# Patient Record
Sex: Male | Born: 1965
Health system: Southern US, Community
[De-identification: ages and names within clinical notes are randomized; demographics above are authoritative.]

## PROBLEM LIST (undated history)

## (undated) DIAGNOSIS — M1990 Unspecified osteoarthritis, unspecified site: Secondary | ICD-10-CM

## (undated) DIAGNOSIS — I10 Essential (primary) hypertension: Secondary | ICD-10-CM

## (undated) DIAGNOSIS — R011 Cardiac murmur, unspecified: Secondary | ICD-10-CM

## (undated) DIAGNOSIS — K219 Gastro-esophageal reflux disease without esophagitis: Secondary | ICD-10-CM

## (undated) DIAGNOSIS — M109 Gout, unspecified: Secondary | ICD-10-CM

## (undated) HISTORY — PX: NO PAST SURGERIES: SHX2092

---

## 2001-07-26 ENCOUNTER — Emergency Department (HOSPITAL_COMMUNITY): Admission: EM | Admit: 2001-07-26 | Discharge: 2001-07-26 | Payer: Self-pay | Admitting: Emergency Medicine

## 2007-04-20 ENCOUNTER — Ambulatory Visit (HOSPITAL_COMMUNITY): Admission: RE | Admit: 2007-04-20 | Discharge: 2007-04-20 | Payer: Self-pay | Admitting: Family Medicine

## 2011-11-09 ENCOUNTER — Other Ambulatory Visit: Payer: Self-pay | Admitting: Otolaryngology

## 2011-11-09 DIAGNOSIS — R439 Unspecified disturbances of smell and taste: Secondary | ICD-10-CM

## 2011-11-13 ENCOUNTER — Ambulatory Visit
Admission: RE | Admit: 2011-11-13 | Discharge: 2011-11-13 | Disposition: A | Discharge status: Home / Self Care | Payer: 59 | Source: Ambulatory Visit | Attending: Otolaryngology | Admitting: Otolaryngology

## 2011-11-13 DIAGNOSIS — R439 Unspecified disturbances of smell and taste: Secondary | ICD-10-CM

## 2012-06-30 ENCOUNTER — Other Ambulatory Visit: Payer: Self-pay | Admitting: Orthopedic Surgery

## 2012-07-11 ENCOUNTER — Other Ambulatory Visit: Payer: Self-pay | Admitting: Orthopedic Surgery

## 2012-07-21 ENCOUNTER — Encounter (HOSPITAL_COMMUNITY): Payer: Self-pay | Admitting: Pharmacy Technician

## 2012-07-23 NOTE — Pre-Procedure Instructions (Signed)
George Maldonado  07/23/2012   Your procedure is scheduled on:  08-01-2012  Monday  Report to [facility] at 5:30 AM.  Call this number if you have problems the morning of surgery: [phone]   Remember:   Do not eat food or drink liquids after midnight.   Take these medicines the morning of surgery with A SIP OF WATER: none   Do not wear jewelry,  Do not wear lotions, powders, or perfumes. .  Do not shave 48 hours prior to surgery. Men may shave face and neck.  Do not bring valuables to the hospital.  Contacts, dentures or bridgework may not be worn into surgery.   Leave suitcase in the car. After surgery it may be brought to your room.   For patients admitted to the hospital, checkout time is 11:00 AM the day of discharge.   Patients discharged the day of surgery will not be allowed to drive home.    Special Instructions: Shower using CHG 2 nights before surgery and the night before surgery.  If you shower the day of surgery use CHG.  Use special wash - you have one bottle of CHG for all showers.  You should use approximately 1/3 of the bottle for each shower.   Please read over the following fact sheets that you were given: Pain Booklet, Coughing and Deep Breathing, Blood Transfusion Information and Surgical Site Infection Prevention

## 2012-07-25 ENCOUNTER — Encounter (HOSPITAL_COMMUNITY)
Admission: RE | Admit: 2012-07-25 | Discharge: 2012-07-25 | Disposition: A | Discharge status: Home / Self Care | Payer: 59 | Source: Ambulatory Visit | Attending: Orthopedic Surgery | Admitting: Orthopedic Surgery

## 2012-07-25 ENCOUNTER — Encounter (HOSPITAL_COMMUNITY): Payer: Self-pay

## 2012-07-25 HISTORY — DX: Essential (primary) hypertension: I10

## 2012-07-25 HISTORY — DX: Gout, unspecified: M10.9

## 2012-07-25 LAB — BASIC METABOLIC PANEL
Calcium: 9.7 mg/dL (ref 8.4–10.5)
GFR calc Af Amer: >90 mL/min (ref >90)
GFR calc non Af Amer: 82 mL/min — ABNORMAL LOW (ref >90)
Glucose, Bld: 94 mg/dL (ref 70–99)
Potassium: 3.9 mEq/L (ref 3.5–5.1)
Sodium: 139 mEq/L (ref 135–145)

## 2012-07-25 LAB — CBC WITH DIFFERENTIAL/PLATELET
Basophils Relative: 1 % (ref 0–1)
Eosinophils Absolute: 0.2 10*3/uL (ref 0.0–0.7)
Lymphs Abs: 2.6 10*3/uL (ref 0.7–4.0)
MCH: 28.5 pg (ref 26.0–34.0)
MCHC: 34.5 g/dL (ref 30.0–36.0)
Neutro Abs: 5 10*3/uL (ref 1.7–7.7)
Neutrophils Relative %: 60 % (ref 43–77)
Platelets: 198 10*3/uL (ref 150–400)
RBC: 5.09 MIL/uL (ref 4.22–5.81)

## 2012-07-25 LAB — ABO/RH: ABO/RH(D): O POS

## 2012-07-25 LAB — TYPE AND SCREEN: ABO/RH(D): O POS

## 2012-07-25 LAB — URINALYSIS, ROUTINE W REFLEX MICROSCOPIC
Bilirubin Urine: NEGATIVE
Glucose, UA: NEGATIVE mg/dL
Ketones, ur: NEGATIVE mg/dL
pH: 5 (ref 5.0–8.0)

## 2012-07-25 LAB — URINE MICROSCOPIC-ADD ON

## 2012-07-25 LAB — PROTIME-INR
INR: 1.01 (ref 0.00–1.49)
Prothrombin Time: 13.2 seconds (ref 11.6–15.2)

## 2012-07-25 LAB — SURGICAL PCR SCREEN: Staphylococcus aureus: NEGATIVE

## 2012-07-26 NOTE — H&P (Signed)
TOTAL HIP ADMISSION H&P  Patient is admitted for right total hip arthroplasty.  Subjective:  Chief Complaint: right hip pain  HPI: George Maldonado, 47 y.o. male, has a history of pain and functional disability in the right hip(s) due to arthritis and patient has failed non-surgical conservative treatments for greater than 12 weeks to include NSAID's and/or analgesics, corticosteriod injections and activity modification.  Onset of symptoms was gradual starting 2 years ago with gradually worsening course since that time.The patient noted no past surgery on the right hip(s).  Patient currently rates pain in the right hip at 8 out of 10 with activity. Patient has night pain and worsening of pain with activity and weight bearing. Patient has evidence of subchondral cysts, periarticular osteophytes and joint space narrowing by imaging studies. This condition presents safety issues increasing the risk of falls.  There is no current active infection.  There are no active problems to display for this patient.  Past Medical History  Diagnosis Date  . Hypertension   . Gout     Past Surgical History  Procedure Laterality Date  . No past surgeries      No prescriptions prior to admission   No Known Allergies  History  Substance Use Topics  . Smoking status: Never Smoker   . Smokeless tobacco: Not on file  . Alcohol Use: No    No family history on file.   Review of Systems  Constitutional: Negative.   HENT: Negative.   Eyes: Negative.   Respiratory: Negative.   Cardiovascular: Negative.   Gastrointestinal: Negative.   Genitourinary: Negative.   Musculoskeletal: Positive for joint pain.  Skin: Negative.   Neurological: Negative.   Endo/Heme/Allergies: Negative.   Psychiatric/Behavioral: Negative.     Objective:  Physical Exam  Constitutional: He is oriented to person, place, and time. He appears well-developed and well-nourished.  HENT:  Head: Normocephalic.  Eyes: Pupils  are equal, round, and reactive to light.  Cardiovascular: Intact distal pulses.   Respiratory: Effort normal.  Musculoskeletal:       Right hip: He exhibits decreased range of motion and crepitus.  Neurological: He is alert and oriented to person, place, and time.  Psychiatric: He has a normal mood and affect.    Vital signs in last 24 hours:    Labs:   There is no height or weight on file to calculate BMI.   Imaging Review Plain radiographs demonstrate severe degenerative joint disease of the right hip(s). The bone quality appears to be adequate for age and reported activity level.  Assessment/Plan:  End stage arthritis, right hip(s)  The patient history, physical examination, clinical judgement of the provider and imaging studies are consistent with end stage degenerative joint disease of the right hip(s) and total hip arthroplasty is deemed medically necessary. The treatment options including medical management, injection therapy, arthroscopy and arthroplasty were discussed at length. The risks and benefits of total hip arthroplasty were presented and reviewed. The risks due to aseptic loosening, infection, stiffness, dislocation/subluxation,  thromboembolic complications and other imponderables were discussed.  The patient acknowledged the explanation, agreed to proceed with the plan and consent was signed. Patient is being admitted for inpatient treatment for surgery, pain control, PT, OT, prophylactic antibiotics, VTE prophylaxis, progressive ambulation and ADL's and discharge planning.The patient is planning to be discharged home with home health services

## 2012-07-31 MED ORDER — DEXTROSE 5 % IV SOLN
3.0000 g | INTRAVENOUS | Status: DC
  Filled 2012-07-31: qty 3000

## 2012-08-01 ENCOUNTER — Inpatient Hospital Stay (HOSPITAL_COMMUNITY)
Admission: RE | Admit: 2012-08-01 | Discharge: 2012-08-02 | DRG: 470 | Disposition: A | Discharge status: Home / Self Care | Payer: 59 | Source: Ambulatory Visit | Attending: Orthopedic Surgery | Admitting: Orthopedic Surgery

## 2012-08-01 ENCOUNTER — Ambulatory Visit (HOSPITAL_COMMUNITY): Payer: 59 | Admitting: Anesthesiology

## 2012-08-01 ENCOUNTER — Encounter (HOSPITAL_COMMUNITY): Payer: Self-pay | Admitting: Anesthesiology

## 2012-08-01 ENCOUNTER — Encounter (HOSPITAL_COMMUNITY): Payer: Self-pay | Admitting: *Deleted

## 2012-08-01 ENCOUNTER — Inpatient Hospital Stay (HOSPITAL_COMMUNITY): Payer: 59

## 2012-08-01 ENCOUNTER — Encounter (HOSPITAL_COMMUNITY)
Admission: RE | Disposition: A | Discharge status: Home / Self Care | Payer: Self-pay | Source: Ambulatory Visit | Attending: Orthopedic Surgery

## 2012-08-01 DIAGNOSIS — I10 Essential (primary) hypertension: Secondary | ICD-10-CM | POA: Diagnosis present

## 2012-08-01 DIAGNOSIS — M1611 Unilateral primary osteoarthritis, right hip: Secondary | ICD-10-CM | POA: Diagnosis present

## 2012-08-01 DIAGNOSIS — M109 Gout, unspecified: Secondary | ICD-10-CM | POA: Diagnosis present

## 2012-08-01 DIAGNOSIS — Z7982 Long term (current) use of aspirin: Secondary | ICD-10-CM

## 2012-08-01 DIAGNOSIS — M169 Osteoarthritis of hip, unspecified: Principal | ICD-10-CM | POA: Diagnosis present

## 2012-08-01 DIAGNOSIS — M1610 Unilateral primary osteoarthritis, unspecified hip: Principal | ICD-10-CM | POA: Diagnosis present

## 2012-08-01 HISTORY — PX: TOTAL HIP ARTHROPLASTY: SHX124

## 2012-08-01 SURGERY — ARTHROPLASTY, HIP, TOTAL,POSTERIOR APPROACH
Anesthesia: General | Site: Hip | Laterality: Right | Wound class: Clean

## 2012-08-01 MED ORDER — PROPOFOL 10 MG/ML IV BOLUS
INTRAVENOUS | Status: DC | PRN
  Administered 2012-08-01: 50 mg via INTRAVENOUS
  Administered 2012-08-01: 150 mg via INTRAVENOUS

## 2012-08-01 MED ORDER — VALSARTAN-HYDROCHLOROTHIAZIDE 80-12.5 MG PO TABS: 1.0000 | ORAL_TABLET | Freq: Every evening | ORAL | Status: DC

## 2012-08-01 MED ORDER — HYDROMORPHONE HCL PF 1 MG/ML IJ SOLN
INTRAMUSCULAR | Status: AC
  Filled 2012-08-01: qty 1

## 2012-08-01 MED ORDER — BUPIVACAINE LIPOSOME 1.3 % IJ SUSP
20.0000 mL | INTRAMUSCULAR | Status: DC
  Filled 2012-08-01: qty 20

## 2012-08-01 MED ORDER — ZOLPIDEM TARTRATE 5 MG PO TABS: 5.0000 mg | ORAL_TABLET | Freq: Every evening | ORAL | Status: DC | PRN

## 2012-08-01 MED ORDER — HYDROMORPHONE HCL PF 1 MG/ML IJ SOLN
0.2500 mg | INTRAMUSCULAR | Status: DC | PRN
  Administered 2012-08-01 (×4): 0.5 mg via INTRAVENOUS

## 2012-08-01 MED ORDER — DIPHENHYDRAMINE HCL 12.5 MG/5ML PO ELIX: 12.5000 mg | ORAL_SOLUTION | ORAL | Status: DC | PRN

## 2012-08-01 MED ORDER — ACETAMINOPHEN 650 MG RE SUPP: 650.0000 mg | Freq: Four times a day (QID) | RECTAL | Status: DC | PRN

## 2012-08-01 MED ORDER — ROCURONIUM BROMIDE 100 MG/10ML IV SOLN
INTRAVENOUS | Status: DC | PRN
  Administered 2012-08-01: 30 mg via INTRAVENOUS
  Administered 2012-08-01: 50 mg via INTRAVENOUS
  Administered 2012-08-01 (×2): 10 mg via INTRAVENOUS

## 2012-08-01 MED ORDER — FLEET ENEMA 7-19 GM/118ML RE ENEM: 1.0000 | ENEMA | Freq: Once | RECTAL | Status: AC | PRN

## 2012-08-01 MED ORDER — ACETAMINOPHEN 10 MG/ML IV SOLN
1000.0000 mg | Freq: Four times a day (QID) | INTRAVENOUS | Status: AC
  Administered 2012-08-01 – 2012-08-02 (×4): 1000 mg via INTRAVENOUS
  Filled 2012-08-01 (×4): qty 100

## 2012-08-01 MED ORDER — DEXTROSE-NACL 5-0.45 % IV SOLN: INTRAVENOUS | Status: DC

## 2012-08-01 MED ORDER — FENTANYL CITRATE 0.05 MG/ML IJ SOLN
INTRAMUSCULAR | Status: DC | PRN
  Administered 2012-08-01 (×2): 100 ug via INTRAVENOUS
  Administered 2012-08-01: 50 ug via INTRAVENOUS

## 2012-08-01 MED ORDER — METOCLOPRAMIDE HCL 5 MG PO TABS
5.0000 mg | ORAL_TABLET | Freq: Three times a day (TID) | ORAL | Status: DC | PRN
  Filled 2012-08-01: qty 2

## 2012-08-01 MED ORDER — ACETAMINOPHEN 325 MG PO TABS: 650.0000 mg | ORAL_TABLET | Freq: Four times a day (QID) | ORAL | Status: DC | PRN

## 2012-08-01 MED ORDER — OXYCODONE HCL 5 MG/5ML PO SOLN: 5.0000 mg | Freq: Once | ORAL | Status: DC | PRN

## 2012-08-01 MED ORDER — HYDROCHLOROTHIAZIDE 12.5 MG PO CAPS
12.5000 mg | ORAL_CAPSULE | Freq: Every day | ORAL | Status: DC
  Administered 2012-08-01 – 2012-08-02 (×2): 12.5 mg via ORAL
  Filled 2012-08-01 (×2): qty 1

## 2012-08-01 MED ORDER — PHENOL 1.4 % MT LIQD: 1.0000 | OROMUCOSAL | Status: DC | PRN

## 2012-08-01 MED ORDER — SODIUM CHLORIDE 0.9 % IJ SOLN
INTRAMUSCULAR | Status: DC | PRN
  Administered 2012-08-01: 08:00:00

## 2012-08-01 MED ORDER — BUPIVACAINE-EPINEPHRINE (PF) 0.5% -1:200000 IJ SOLN
INTRAMUSCULAR | Status: AC
  Filled 2012-08-01: qty 10

## 2012-08-01 MED ORDER — MENTHOL 3 MG MT LOZG: 1.0000 | LOZENGE | OROMUCOSAL | Status: DC | PRN

## 2012-08-01 MED ORDER — METHOCARBAMOL 100 MG/ML IJ SOLN
500.0000 mg | Freq: Four times a day (QID) | INTRAVENOUS | Status: DC | PRN
  Filled 2012-08-01: qty 5

## 2012-08-01 MED ORDER — BISACODYL 5 MG PO TBEC: 5.0000 mg | DELAYED_RELEASE_TABLET | Freq: Every day | ORAL | Status: DC | PRN

## 2012-08-01 MED ORDER — LIDOCAINE HCL (CARDIAC) 20 MG/ML IV SOLN
INTRAVENOUS | Status: DC | PRN
  Administered 2012-08-01: 100 mg via INTRAVENOUS

## 2012-08-01 MED ORDER — HYDROMORPHONE HCL PF 1 MG/ML IJ SOLN
1.0000 mg | INTRAMUSCULAR | Status: DC | PRN
  Administered 2012-08-01 – 2012-08-02 (×3): 1 mg via INTRAVENOUS
  Filled 2012-08-01 (×3): qty 1

## 2012-08-01 MED ORDER — MEPERIDINE HCL 25 MG/ML IJ SOLN: 6.2500 mg | INTRAMUSCULAR | Status: DC | PRN

## 2012-08-01 MED ORDER — MAGNESIUM HYDROXIDE 400 MG/5ML PO SUSP: 30.0000 mL | Freq: Every day | ORAL | Status: DC | PRN

## 2012-08-01 MED ORDER — NEOSTIGMINE METHYLSULFATE 1 MG/ML IJ SOLN
INTRAMUSCULAR | Status: DC | PRN
  Administered 2012-08-01: 5 mg via INTRAVENOUS

## 2012-08-01 MED ORDER — OXYCODONE HCL 5 MG PO TABS: 5.0000 mg | ORAL_TABLET | Freq: Once | ORAL | Status: DC | PRN

## 2012-08-01 MED ORDER — ARTIFICIAL TEARS OP OINT
TOPICAL_OINTMENT | OPHTHALMIC | Status: DC | PRN
  Administered 2012-08-01: 1 via OPHTHALMIC

## 2012-08-01 MED ORDER — GLYCOPYRROLATE 0.2 MG/ML IJ SOLN
INTRAMUSCULAR | Status: DC | PRN
  Administered 2012-08-01: .8 mg via INTRAVENOUS

## 2012-08-01 MED ORDER — SODIUM CHLORIDE 0.9 % IJ SOLN
INTRAMUSCULAR | Status: AC
  Filled 2012-08-01: qty 6

## 2012-08-01 MED ORDER — ASPIRIN EC 325 MG PO TBEC
325.0000 mg | DELAYED_RELEASE_TABLET | Freq: Two times a day (BID) | ORAL | Status: DC
  Administered 2012-08-01 – 2012-08-02 (×2): 325 mg via ORAL
  Filled 2012-08-01 (×4): qty 1

## 2012-08-01 MED ORDER — ALBUMIN HUMAN 5 % IV SOLN
INTRAVENOUS | Status: DC | PRN
  Administered 2012-08-01: 09:00:00 via INTRAVENOUS

## 2012-08-01 MED ORDER — CHLORHEXIDINE GLUCONATE 4 % EX LIQD: 60.0000 mL | Freq: Once | CUTANEOUS | Status: DC

## 2012-08-01 MED ORDER — SODIUM CHLORIDE 0.9 % IR SOLN
Status: DC | PRN
  Administered 2012-08-01: 1000 mL

## 2012-08-01 MED ORDER — METHOCARBAMOL 500 MG PO TABS
500.0000 mg | ORAL_TABLET | Freq: Four times a day (QID) | ORAL | Status: DC | PRN
  Administered 2012-08-01: 500 mg via ORAL
  Filled 2012-08-01: qty 1

## 2012-08-01 MED ORDER — METOCLOPRAMIDE HCL 5 MG/ML IJ SOLN: 5.0000 mg | Freq: Three times a day (TID) | INTRAMUSCULAR | Status: DC | PRN

## 2012-08-01 MED ORDER — ALUM & MAG HYDROXIDE-SIMETH 200-200-20 MG/5ML PO SUSP: 30.0000 mL | ORAL | Status: DC | PRN

## 2012-08-01 MED ORDER — LACTATED RINGERS IV SOLN
INTRAVENOUS | Status: DC | PRN
  Administered 2012-08-01 (×3): via INTRAVENOUS

## 2012-08-01 MED ORDER — CELECOXIB 200 MG PO CAPS
200.0000 mg | ORAL_CAPSULE | Freq: Two times a day (BID) | ORAL | Status: DC
  Administered 2012-08-01 – 2012-08-02 (×3): 200 mg via ORAL
  Filled 2012-08-01 (×4): qty 1

## 2012-08-01 MED ORDER — DEXTROSE 5 % IV SOLN
3.0000 g | INTRAVENOUS | Status: DC | PRN
  Administered 2012-08-01: 3 g via INTRAVENOUS

## 2012-08-01 MED ORDER — SIMVASTATIN 20 MG PO TABS
20.0000 mg | ORAL_TABLET | Freq: Every evening | ORAL | Status: DC
  Administered 2012-08-01 – 2012-08-02 (×2): 20 mg via ORAL
  Filled 2012-08-01 (×2): qty 1

## 2012-08-01 MED ORDER — ONDANSETRON HCL 4 MG PO TABS: 4.0000 mg | ORAL_TABLET | Freq: Four times a day (QID) | ORAL | Status: DC | PRN

## 2012-08-01 MED ORDER — MIDAZOLAM HCL 5 MG/5ML IJ SOLN
INTRAMUSCULAR | Status: DC | PRN
  Administered 2012-08-01 (×2): 1 mg via INTRAVENOUS

## 2012-08-01 MED ORDER — KCL IN DEXTROSE-NACL 20-5-0.45 MEQ/L-%-% IV SOLN
INTRAVENOUS | Status: DC
  Administered 2012-08-01: 13:00:00 via INTRAVENOUS
  Filled 2012-08-01 (×6): qty 1000

## 2012-08-01 MED ORDER — ALLOPURINOL 100 MG PO TABS
100.0000 mg | ORAL_TABLET | Freq: Every day | ORAL | Status: DC | PRN
  Filled 2012-08-01: qty 1

## 2012-08-01 MED ORDER — OXYCODONE HCL 5 MG PO TABS
5.0000 mg | ORAL_TABLET | ORAL | Status: DC | PRN
Start: 2012-08-01 — End: 2012-08-02
  Administered 2012-08-01 – 2012-08-02 (×4): 10 mg via ORAL
  Filled 2012-08-01 (×4): qty 2

## 2012-08-01 MED ORDER — DEXTROSE 5 % IV SOLN
INTRAVENOUS | Status: DC | PRN
  Administered 2012-08-01: 08:00:00 via INTRAVENOUS

## 2012-08-01 MED ORDER — ONDANSETRON HCL 4 MG/2ML IJ SOLN
INTRAMUSCULAR | Status: DC | PRN
  Administered 2012-08-01: 4 mg via INTRAVENOUS

## 2012-08-01 MED ORDER — ONDANSETRON HCL 4 MG/2ML IJ SOLN: 4.0000 mg | Freq: Four times a day (QID) | INTRAMUSCULAR | Status: DC | PRN

## 2012-08-01 MED ORDER — ONDANSETRON HCL 4 MG/2ML IJ SOLN: 4.0000 mg | Freq: Once | INTRAMUSCULAR | Status: DC | PRN

## 2012-08-01 MED ORDER — BUPIVACAINE-EPINEPHRINE 0.5% -1:200000 IJ SOLN
INTRAMUSCULAR | Status: DC | PRN
  Administered 2012-08-01: 1 mL

## 2012-08-01 MED ORDER — IRBESARTAN 75 MG PO TABS
75.0000 mg | ORAL_TABLET | Freq: Every day | ORAL | Status: DC
  Administered 2012-08-01 – 2012-08-02 (×2): 75 mg via ORAL
  Filled 2012-08-01 (×2): qty 1

## 2012-08-01 SURGICAL SUPPLY — 54 items
BLADE SAW SAG 73X25 THK (BLADE) ×1
BLADE SAW SGTL 18X1.27X75 (BLADE) IMPLANT
BLADE SAW SGTL 73X25 THK (BLADE) ×1 IMPLANT
BLADE SAW SGTL MED 73X18.5 STR (BLADE) IMPLANT
BRUSH FEMORAL CANAL (MISCELLANEOUS) IMPLANT
CLOTH BEACON ORANGE TIMEOUT ST (SAFETY) ×2 IMPLANT
COVER BACK TABLE 24X17X13 BIG (DRAPES) IMPLANT
COVER SURGICAL LIGHT HANDLE (MISCELLANEOUS) ×4 IMPLANT
DRAPE ORTHO SPLIT 77X108 STRL (DRAPES) ×2
DRAPE PROXIMA HALF (DRAPES) ×2 IMPLANT
DRAPE SURG ORHT 6 SPLT 77X108 (DRAPES) ×1 IMPLANT
DRAPE U-SHAPE 47X51 STRL (DRAPES) ×2 IMPLANT
DRILL BIT 7/64X5 (BIT) ×2 IMPLANT
DRSG MEPILEX BORDER 4X12 (GAUZE/BANDAGES/DRESSINGS) ×2 IMPLANT
DRSG MEPILEX BORDER 4X8 (GAUZE/BANDAGES/DRESSINGS) ×1 IMPLANT
DURAPREP 26ML APPLICATOR (WOUND CARE) ×2 IMPLANT
ELECT BLADE 4.0 EZ CLEAN MEGAD (MISCELLANEOUS)
ELECT REM PT RETURN 9FT ADLT (ELECTROSURGICAL) ×2
ELECTRODE BLDE 4.0 EZ CLN MEGD (MISCELLANEOUS) IMPLANT
ELECTRODE REM PT RTRN 9FT ADLT (ELECTROSURGICAL) ×1 IMPLANT
GAUZE XEROFORM 1X8 LF (GAUZE/BANDAGES/DRESSINGS) ×1 IMPLANT
GLOVE BIO SURGEON STRL SZ7 (GLOVE) ×2 IMPLANT
GLOVE BIO SURGEON STRL SZ7.5 (GLOVE) ×2 IMPLANT
GLOVE BIOGEL PI IND STRL 7.0 (GLOVE) ×1 IMPLANT
GLOVE BIOGEL PI IND STRL 8 (GLOVE) ×1 IMPLANT
GLOVE BIOGEL PI INDICATOR 7.0 (GLOVE) ×1
GLOVE BIOGEL PI INDICATOR 8 (GLOVE) ×1
GOWN PREVENTION PLUS XLARGE (GOWN DISPOSABLE) ×2 IMPLANT
GOWN STRL NON-REIN LRG LVL3 (GOWN DISPOSABLE) ×4 IMPLANT
HANDPIECE INTERPULSE COAX TIP (DISPOSABLE)
HOOD PEEL AWAY FACE SHEILD DIS (HOOD) ×4 IMPLANT
KIT BASIN OR (CUSTOM PROCEDURE TRAY) ×2 IMPLANT
KIT ROOM TURNOVER OR (KITS) ×2 IMPLANT
MANIFOLD NEPTUNE II (INSTRUMENTS) ×2 IMPLANT
NEEDLE 22X1 1/2 (OR ONLY) (NEEDLE) ×2 IMPLANT
NS IRRIG 1000ML POUR BTL (IV SOLUTION) ×2 IMPLANT
PACK TOTAL JOINT (CUSTOM PROCEDURE TRAY) ×2 IMPLANT
PAD ARMBOARD 7.5X6 YLW CONV (MISCELLANEOUS) ×4 IMPLANT
PASSER SUT SWANSON 36MM LOOP (INSTRUMENTS) ×2 IMPLANT
PRESSURIZER FEMORAL UNIV (MISCELLANEOUS) IMPLANT
SET HNDPC FAN SPRY TIP SCT (DISPOSABLE) IMPLANT
SPONGE LAP 18X18 X RAY DECT (DISPOSABLE) ×2 IMPLANT
SUT ETHIBOND 2 V 37 (SUTURE) ×2 IMPLANT
SUT ETHILON 3 0 FSL (SUTURE) ×2 IMPLANT
SUT VIC AB 0 CTB1 27 (SUTURE) ×2 IMPLANT
SUT VIC AB 1 CTX 36 (SUTURE) ×2
SUT VIC AB 1 CTX36XBRD ANBCTR (SUTURE) ×1 IMPLANT
SUT VIC AB 2-0 CTB1 (SUTURE) ×2 IMPLANT
SYR CONTROL 10ML LL (SYRINGE) ×2 IMPLANT
TOWEL OR 17X24 6PK STRL BLUE (TOWEL DISPOSABLE) ×2 IMPLANT
TOWEL OR 17X26 10 PK STRL BLUE (TOWEL DISPOSABLE) ×2 IMPLANT
TOWER CARTRIDGE SMART MIX (DISPOSABLE) IMPLANT
TRAY FOLEY CATH 14FR (SET/KITS/TRAYS/PACK) IMPLANT
WATER STERILE IRR 1000ML POUR (IV SOLUTION) ×6 IMPLANT

## 2012-08-01 NOTE — Anesthesia Procedure Notes (Signed)
Procedure Name: Intubation Date/Time: 08/01/2012 7:37 AM Performed by: Marni Griffon Pre-anesthesia Checklist: Patient identified, Emergency Drugs available, Suction available and Patient being monitored Patient Re-evaluated:Patient Re-evaluated prior to inductionOxygen Delivery Method: Circle system utilized Preoxygenation: Pre-oxygenation with 100% oxygen Intubation Type: IV induction Ventilation: Mask ventilation without difficulty Laryngoscope Size: Mac and 4 Grade View: Grade II Tube type: Oral Tube size: 8.0 mm Number of attempts: 2 (Dr. Michelle Piper asked CRNA to scope b/f NDMR fully on board) Airway Equipment and Method: Stylet Placement Confirmation: ETT inserted through vocal cords under direct vision,  breath sounds checked- equal and bilateral and positive ETCO2 Secured at: 22 (cm at teeth) cm Tube secured with: Tape Dental Injury: Dental damage  Comments: Ground small amt powder from left incisor; edge felt smooth after intubation.

## 2012-08-01 NOTE — Evaluation (Signed)
Physical Therapy Evaluation Patient Details Name: George Maldonado MRN: [id number] DOB: 1965-09-07 Today's Date: 08/01/2012 Time: 4098-1191 PT Time Calculation (min): 34 min  PT Assessment / Plan / Recommendation Clinical Impression  Pt is a pleasant 47 y.o. male s/p R THA POD#0. Pt is highly motivated. Presents with decreased mobility, decreased independence with gt/transfers secondary to pain. Anticipate D/C tomorrow if medically stable and rehab goals are met. Will need to attempt stairs in morning session. Pt will benefit from skilled PT to maximize functional mobility. Plan for HHPT upon D/C.    PT Assessment  Patient needs continued PT services    Follow Up Recommendations  Home health PT;Supervision - Intermittent    Does the patient have the potential to tolerate intense rehabilitation      Barriers to Discharge        Equipment Recommendations  Rolling walker with 5" wheels;Other (comment) (3 in 1 commode )    Recommendations for Other Services OT consult   Frequency 7X/week    Precautions / Restrictions Precautions Precautions: Posterior Hip;Fall Precaution Booklet Issued: Yes (comment) Precaution Comments: pt given handout on hip precautions; at end of session able to recall 2/3 indpendently  Restrictions Weight Bearing Restrictions: Yes RLE Weight Bearing: Weight bearing as tolerated   Pertinent Vitals/Pain 6/10 prior to therapy; pre medicated; 6.5/10 at end of session; pt repositioned in chair.       Mobility  Bed Mobility Bed Mobility: Supine to Sit;Sitting - Scoot to Edge of Bed Supine to Sit: 5: Supervision;With rails Sitting - Scoot to Edge of Bed: 5: Supervision Details for Bed Mobility Assistance: demo good technique; required min cues for hand placement and sequencing to adhere to hip precautions Transfers Transfers: Sit to Stand;Stand to Sit Sit to Stand: 4: Min assist;From elevated surface;With upper extremity assist;From bed Stand to Sit:  4: Min guard;With armrests;With upper extremity assist;To chair/3-in-1 Details for Transfer Assistance: cues for hip precautions, hand placement and RW safety; pt anxious intially  Ambulation/Gait Ambulation/Gait Assistance: 4: Min guard Ambulation Distance (Feet): 200 Feet Assistive device: Rolling walker Ambulation/Gait Assistance Details: encouraged to equalize weightshifting and to increase step length on L LE and increase heel strike on R LE; correctable with cues  Gait Pattern: Decreased step length - left;Decreased stance time - right;Antalgic Gait velocity: decreased Stairs: No Wheelchair Mobility Wheelchair Mobility: No    Exercises Total Joint Exercises Ankle Circles/Pumps: AROM;Both;10 reps;Supine Long Arc Quad: AROM;Right;10 reps;Seated   PT Diagnosis: Difficulty walking;Acute pain  PT Problem List: Decreased strength;Decreased range of motion;Decreased activity tolerance;Decreased balance;Decreased mobility;Decreased knowledge of use of DME;Pain;Decreased knowledge of precautions PT Treatment Interventions: DME instruction;Gait training;Stair training;Functional mobility training;Therapeutic activities;Therapeutic exercise;Balance training;Neuromuscular re-education;Patient/family education   PT Goals Acute Rehab PT Goals PT Goal Formulation: With patient Time For Goal Achievement: 08/06/12 Potential to Achieve Goals: Good Pt will go Supine/Side to Sit: with modified independence PT Goal: Supine/Side to Sit - Progress: Goal set today Pt will go Sit to Stand: with modified independence PT Goal: Sit to Stand - Progress: Goal set today Pt will go Stand to Sit: with modified independence PT Goal: Stand to Sit - Progress: Goal set today Pt will Transfer Bed to Chair/Chair to Bed: with modified independence PT Transfer Goal: Bed to Chair/Chair to Bed - Progress: Goal set today Pt will Ambulate: >150 feet;with modified independence;with least restrictive assistive device PT  Goal: Ambulate - Progress: Goal set today Pt will Go Up / Down Stairs: 3-5 stairs;with supervision;with rail(s) PT Goal: Up/Down Stairs -  Progress: Goal set today Pt will Perform Home Exercise Program: with supervision, verbal cues required/provided PT Goal: Perform Home Exercise Program - Progress: Goal set today Additional Goals Additional Goal #1: Pt will be able to indpendently recall 3/3 posterior hip preacutions.  PT Goal: Additional Goal #1 - Progress: Goal set today  Visit Information  Last PT Received On: 08/01/12 Assistance Needed: +1    Subjective Data  Subjective: "we are getting up now? I hope to go home tomorrow so lets do this"  Patient Stated Goal: home tomorrow with wife   Prior Functioning  Home Living Lives With: Spouse Available Help at Discharge: Family;Available 24 hours/day Type of Home: House Home Access: Stairs to enter [employer] of Steps: 2 (2-10 pending approach he wants to attempt ) Entrance Stairs-Rails: Right Home Layout: One level Bathroom Shower/Tub: Tub/shower unit;C[employer]ain Firefighter: Handicapped height Bathroom Accessibility: Yes How Accessible: Accessible via walker Home Adaptive Equipment: None Prior Function Level of Independence: Independent Able to Take Stairs?: Yes Driving: Yes 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 Communication: No difficulties Dominant Hand: Right    Cognition  Cognition Arousal/Alertness: Awake/alert Behavior During Therapy: WFL for tasks assessed/performed Overall Cognitive Status: Within Functional Limits for tasks assessed    Extremity/Trunk Assessment Right Lower Extremity Assessment RLE ROM/Strength/Tone: Deficits;Unable to fully assess;Due to pain;Due to precautions RLE ROM/Strength/Tone Deficits: Knee and ankle WFL RLE Sensation: WFL - Light Touch Left Lower Extremity Assessment LLE ROM/Strength/Tone: Within functional levels LLE Sensation: WFL -  Light Touch Trunk Assessment Trunk Assessment: Normal   Balance Balance Balance Assessed: No  End of Session PT - End of Session Equipment Utilized During Treatment: Gait belt Activity Tolerance: Patient tolerated treatment well Patient left: in chair;with call bell/phone within reach;with family/visitor present Nurse Communication: Mobility status;Patient requests pain meds  GP     Donell Sievert, [place] [phone] 08/01/2012, 3:57 PM

## 2012-08-01 NOTE — Op Note (Signed)
OPERATIVE REPORT    DATE OF PROCEDURE:  08/01/2012       PREOPERATIVE DIAGNOSIS:  OSTEOARTHRITIS RIGHT HIP                                                          POSTOPERATIVE DIAGNOSIS:  osteoarthritis right hip                                                           PROCEDURE:  R total hip arthroplasty using a 58 mm DePuy Pinnacle  Cup, [employer], 10-degree polyethylene liner index superior  and posterior, a +0 36 mm ceramic head, a [phone] SROM stem, 20FXXL Sleeve   SURGEON: Caliann Leckrone J    ASSISTANT:   Mauricia Area, PA-C  (present throughout entire procedure and necessary for timely completion of the procedure)   ANESTHESIA: General BLOOD LOSS: 600 FLUID REPLACEMENT: 2000 crystalloid DRAINS: Foley Catheter URINE OUTPUT: 300cc COMPLICATIONS: none    INDICATIONS FOR PROCEDURE: A 47 y.o. year-old With  OSTEOARTHRITIS RIGHT HIP   for 3 years, x-rays show bone-on-bone arthritic changes. Despite conservative measures with observation, anti-inflammatory medicine, narcotics, use of a cane, has severe unremitting pain and can ambulate only a few blocks before resting.  Patient desires elective R total hip arthroplasty to decrease pain and increase function. The risks, benefits, and alternatives were discussed at length including but not limited to the risks of infection, bleeding, nerve injury, stiffness, blood clots, the need for revision surgery, cardiopulmonary complications, among others, and they were willing to proceed. Questions answered     PROCEDURE IN DETAIL: The patient was identified by armband,  received preoperative IV antibiotics in the holding area at [facility], taken to the operating room , appropriate anesthetic monitors  were attached and general endotracheal anesthesia induced. Foley catheter was inserted. Pt was rolled into the L lateral decubitus position and fixed there with a Stulberg Mark II pelvic clamp.  The R lower extremity was  then prepped and draped  in the usual sterile fashion from the ankle to the hemipelvis. A time-out  procedure was performed. The skin along the lateral hip and thigh  infiltrated with 10 mL of 0.5% Marcaine and epinephrine solution. We  then made a posterolateral approach to the hip. With a #10 blade, a 20 cm  incision was made through the skin and subcutaneous tissue down to the level of the  IT band. Small bleeders were identified and cauterized. The IT band was cut in  line with skin incision exposing the greater trochanter. A Cobra retractor was placed between the gluteus minimus and the superior hip joint capsule, and a spiked Cobra between the quadratus femoris and the inferior hip joint capsule. This isolated the short  external rotators and piriformis tendons. These were tagged with a #2 Ethibond  suture and cut off their insertion on the intertrochanteric crest. The posterior  capsule was then developed into an acetabular-based flap from Posterior Superior off of the acetabulum out over the femoral neck and back posterior inferior to the acetabular rim. This flap was tagged with two #2 Ethibond sutures  and retracted protecting the sciatic nerve. This exposed the arthritic femoral head and osteophytes. The hip was then flexed and internally rotated, dislocating the femoral head and a standard neck cut performed 1 fingerbreadth above the lesser trochanter.  A spiked Cobra was placed in the cotyloid notch and a Hohmann retractor was then used to lever the femur anteriorly off of the anterior pelvic column. A posterior-inferior wing retractor was placed at the junction of the acetabulum and the ischium completing the acetabular exposure.We then removed the peripheral osteophytes and labrum from the acetabulum. We then reamed the acetabulum up to 57 mm with basket reamers obtaining good coverage in all quadrants. We then irrigated with normal  saline solution and hammered into place a 58 mm pinnacle  cup in 45  degrees of abduction and about 20 degrees of anteversion. More  peripheral osteophytes removed and a trial 10-degree liner placed with the  index superior-posterior. The hip was then flexed and internally rotated exposing the  proximal femur, which was entered with the initiating reamer followed by  the axial reamers up to a 15.5 mm full depth and 16mm partial depth. We then conically reamed to 28F to the correct depth for a 42 base neck. The calcar was milled to 28FXXL. A trial cone and stem was inserted in the 25 degrees anteversion, with a +0 36mm trial head. Trial reduction was then performed and excellent stability was noted with at 90 of flexion with 75 of internal rotation and then full extension with maximal external rotation. The hip could not be dislocated in full extension. The knee could easily flex  to about 130 degrees. We also stretched the abductors at this point,  because of the preexisting adductor contractures. All trial components  were then removed. The acetabulum was irrigated out with normal saline  solution. A titanium Apex [facility] was then screwed into place  followed by a 10-degree polyethylene liner index superior-posterior. On  the femoral side a 28FXXL ZTT1 sleeve was hammered into place, followed by a [id number] SROM stem in 25 degrees of anteversion. At this point, a +0 36 mm ceramic head was  hammered on the stem. The hip was reduced. We checked our stability  one more time and found it to be excellent. The wound was once again  thoroughly irrigated out with normal saline solution pulse lavage. The  capsular flap and short external rotators were repaired back to the  intertrochanteric crest through drill holes with a #2 Ethibond suture.  The IT band was closed with running 1 Vicryl suture. The subcutaneous  tissue with 0 and 2-0 undyed Vicryl suture and the skin with running  interlocking 3-0 nylon suture. Dressing of Xeroform and Mepilex was   then applied. The patient was then unclamped, rolled supine, awaken extubated and taken to recovery room without difficulty in stable condition.   Tracen Mahler J 08/01/2012, 9:28 AM

## 2012-08-01 NOTE — Addendum Note (Signed)
Addendum created 08/01/12 1105 by Marni Griffon, CRNA   Modules edited: Anesthesia Medication Administration

## 2012-08-01 NOTE — Transfer of Care (Signed)
Immediate Anesthesia Transfer of Care Note  Patient: George Maldonado  Procedure(s) Performed: Procedure(s) with comments: TOTAL HIP ARTHROPLASTY (Right) - DEPUY, NEEDS LONG TABLE PATIENT 6'8" 290 LBS  Patient Location: PACU  Anesthesia Type:General  Level of Consciousness: awake, alert , oriented and patient cooperative  Airway & Oxygen Therapy: Patient Spontanous Breathing and Patient connected to nasal cannula oxygen  Post-op Assessment: Report given to PACU RN, Post -op Vital signs reviewed and stable and Patient moving all extremities  Post vital signs: Reviewed and stable  Complications: No apparent anesthesia complications

## 2012-08-01 NOTE — Interval H&P Note (Signed)
History and Physical Interval Note:  08/01/2012 7:11 AM  George Maldonado  has presented today for surgery, with the diagnosis of OSTEOARTHRITIS RIGHT HIP  The various methods of treatment have been discussed with the patient and family. After consideration of risks, benefits and other options for treatment, the patient has consented to  Procedure(s) with comments: TOTAL HIP ARTHROPLASTY (Right) - DEPUY, NEEDS LONG TABLE PATIENT 6'8" 290 LBS as a surgical intervention .  The patient's history has been reviewed, patient examined, no change in status, stable for surgery.  I have reviewed the patient's chart and labs.  Questions were answered to the patient's satisfaction.     Nestor Lewandowsky

## 2012-08-01 NOTE — Anesthesia Preprocedure Evaluation (Addendum)
Anesthesia Evaluation  Patient identified by MRN, date of birth, ID band Patient awake    Reviewed: Allergy & Precautions, H&P , NPO status , Patient's Chart, lab work & pertinent test results, reviewed documented beta blocker date and time   Airway Mallampati: I TM Distance: >3 FB Neck ROM: Full    Dental  (+) Teeth Intact and Dental Advisory Given   Pulmonary          Cardiovascular hypertension, Pt. on medications     Neuro/Psych    GI/Hepatic   Endo/Other    Renal/GU      Musculoskeletal   Abdominal   Peds  Hematology   Anesthesia Other Findings   Reproductive/Obstetrics                         Anesthesia Physical Anesthesia Plan  ASA: II  Anesthesia Plan: General   Post-op Pain Management:    Induction: Intravenous  Airway Management Planned: Oral ETT  Additional Equipment:   Intra-op Plan:   Post-operative Plan: Extubation in OR  Informed Consent: I have reviewed the patients History and Physical, chart, labs and discussed the procedure including the risks, benefits and alternatives for the proposed anesthesia with the patient or authorized representative who has indicated his/her understanding and acceptance.     Plan Discussed with: CRNA and Surgeon  Anesthesia Plan Comments:         Anesthesia Quick Evaluation

## 2012-08-01 NOTE — Interval H&P Note (Signed)
History and Physical Interval Note:  08/01/2012 7:10 AM  George Maldonado  has presented today for surgery, with the diagnosis of OSTEOARTHRITIS RIGHT HIP  The various methods of treatment have been discussed with the patient and family. After consideration of risks, benefits and other options for treatment, the patient has consented to  Procedure(s) with comments: TOTAL HIP ARTHROPLASTY (Right) - DEPUY, NEEDS LONG TABLE PATIENT 6'8" 290 LBS as a surgical intervention .  The patient's history has been reviewed, patient examined, no change in status, stable for surgery.  I have reviewed the patient's chart and labs.  Questions were answered to the patient's satisfaction.     Nestor Lewandowsky

## 2012-08-01 NOTE — Plan of Care (Signed)
Problem: Consults Goal: Diagnosis- Total Joint Replacement Primary Total Hip     

## 2012-08-01 NOTE — Preoperative (Signed)
Beta Blockers   Reason not to administer Beta Blockers:Not Applicable 

## 2012-08-01 NOTE — Anesthesia Postprocedure Evaluation (Signed)
Anesthesia Post Note  Patient: George Maldonado  Procedure(s) Performed: Procedure(s) (LRB): TOTAL HIP ARTHROPLASTY (Right)  Anesthesia type: general  Patient location: PACU  Post pain: Pain level controlled  Post assessment: Patient's Cardiovascular Status Stable  Last Vitals:  Filed Vitals:   08/01/12 1029  BP: 160/92  Pulse: 57  Temp:   Resp: 19    Post vital signs: Reviewed and stable  Level of consciousness: sedated  Complications: No apparent anesthesia complications

## 2012-08-02 ENCOUNTER — Encounter (HOSPITAL_COMMUNITY): Payer: Self-pay | Admitting: Orthopedic Surgery

## 2012-08-02 DIAGNOSIS — M1611 Unilateral primary osteoarthritis, right hip: Secondary | ICD-10-CM | POA: Diagnosis present

## 2012-08-02 LAB — BASIC METABOLIC PANEL
CO2: 30 mEq/L (ref 19–32)
Calcium: 8.1 mg/dL — ABNORMAL LOW (ref 8.4–10.5)
Creatinine, Ser: 1.29 mg/dL (ref 0.50–1.35)
Glucose, Bld: 113 mg/dL — ABNORMAL HIGH (ref 70–99)
Sodium: 135 mEq/L (ref 135–145)

## 2012-08-02 LAB — CBC
Hemoglobin: 9.8 g/dL — ABNORMAL LOW (ref 13.0–17.0)
MCH: 28.2 pg (ref 26.0–34.0)
MCV: 83.9 fL (ref 78.0–100.0)
RBC: 3.48 MIL/uL — ABNORMAL LOW (ref 4.22–5.81)

## 2012-08-02 MED ORDER — ASPIRIN 325 MG PO TBEC: 325.0000 mg | DELAYED_RELEASE_TABLET | Freq: Two times a day (BID) | ORAL | Status: DC

## 2012-08-02 MED ORDER — METHOCARBAMOL 500 MG PO TABS: 500.0000 mg | ORAL_TABLET | Freq: Four times a day (QID) | ORAL | Status: DC | PRN

## 2012-08-02 MED ORDER — OXYCODONE HCL 5 MG PO TABS: 5.0000 mg | ORAL_TABLET | ORAL | Status: DC | PRN

## 2012-08-02 NOTE — Progress Notes (Signed)
RN notified George Maldonado, [place] of patients inability to void. She agrees to increase his water intake and keep him until he is able to void. Patient will be discharged if he is able to void spontaneously.

## 2012-08-02 NOTE — Progress Notes (Signed)
Patient able to spontaneously void . He was provided with discharge instructions and follow up information and is going home with his wife. He is aware of hip precautions and when to call the MD if needed for signs/symptoms of infection.

## 2012-08-02 NOTE — Progress Notes (Addendum)
Patient ID: George Maldonado, male   DOB: [birth year], 47 y.o.   MRN: [id number] PATIENT ID: George Maldonado  MRN: [id number]  DOB/AGE:  1965-07-27 / 47 y.o.  1 Day Post-Op Procedure(s) (LRB): TOTAL HIP ARTHROPLASTY (Right)    PROGRESS NOTE Subjective: Patient is alert, oriented,no Nausea, no Vomiting, no passing gas, no Bowel Movement. Taking PO well. Denies SOB, Chest or Calf Pain. Using Incentive Spirometer, PAS in place. Ambulate WBAT Patient reports pain as 5 on 0-10 scale  .    Objective: Vital signs in last 24 hours: Filed Vitals:   08/01/12 2352 08/02/12 0131 08/02/12 0451 08/02/12 0600  BP:  92/46 107/55 94/60  Pulse:  80  68  Temp: 98.8 F (37.1 C) 99.4 F (37.4 C)  100.2 F (37.9 C)  TempSrc: Oral Oral  Oral  Resp:  18  18  SpO2:  96%  95%      Intake/Output from previous day: I/O last 3 completed shifts: In: 3280 [P.O.:480; I.V.:2550; IV Piggyback:250] Out: 1300 [Urine:775; Blood:525]   Intake/Output this shift: Total I/O In: 1620 [P.O.:120; I.V.:1500] Out: 1200 [Urine:1200]   LABORATORY DATA:  Recent Labs  08/02/12 0550  WBC 8.5  HGB 9.8*  HCT 29.2*  PLT 156    Examination: Neurologically intact ABD soft Neurovascular intact Sensation intact distally Intact pulses distally Dorsiflexion/Plantar flexion intact Incision: moderate drainage No cellulitis present Compartment soft} XR AP&Lat of hip shows well placed\fixed THA  Assessment:   1 Day Post-Op Procedure(s) (LRB): TOTAL HIP ARTHROPLASTY (Right) ADDITIONAL DIAGNOSIS:  gout  Plan: PT/OT WBAT, THA  posterior precautions  DVT Prophylaxis: SCDx72 hrs, ASA 325 mg BID x 2 weeks  DISCHARGE PLAN: Home, today if passes PT  DISCHARGE NEEDS: HHPT, HHRN, CPM, Walker and 3-in-1 comode seat

## 2012-08-02 NOTE — Care Management Note (Signed)
CARE MANAGEMENT NOTE 08/02/2012  Patient:  George Maldonado,George Maldonado   Account Number:  [id number]  Date Initiated:  08/02/2012  Documentation initiated by:  Vance Peper  Subjective/Objective Assessment:   47 yr old male s/p right total hip arthroplasty.     Action/Plan:   Cm spoke with patient and wife concerning home health and DME needs at discharge. Choice offered .[facility] pre[facility] with [facility], no changes. DME being delivered to patient's room.   Anticipated DC Date:  08/02/2012   Anticipated DC Plan:  HOME W HOME HEALTH SERVICES      DC Planning Services  CM consult      PAC Choice  DURABLE MEDICAL EQUIPMENT  HOME HEALTH   Choice offered to / List presented to:  C-1 Patient   DME arranged  WALKER - TALL  WALKER - ROLLING  3-N-1  TUB BENCH      DME agency  [employer]     HH arranged  HH-2 PT      HH agency  [facility]   Status of service:  Completed, signed off Medicare Important Message given?   (If response is "NO", the following Medicare IM given date fields will be blank) Date Medicare IM given:   Date Additional Medicare IM given:    Discharge Disposition:  HOME W HOME HEALTH SERVICES  Per UR Regulation:    If discussed at Long Length of Stay Meetings, dates discussed:    Comments:

## 2012-08-02 NOTE — Evaluation (Signed)
Occupational Therapy Evaluation Patient Details Name: George Maldonado MRN: [id number] DOB: 1965-09-20 Today's Date: 08/02/2012 Time: 4098-1191 OT Time Calculation (min): 26 min  OT Assessment / Plan / Recommendation Clinical Impression    Pt is a pleasant 47 y.o. male s/p R THA.  Pt is highly motivated. Pt at [place] level for LB ADLs with sit to stand transfer. Practiced tub transfer with tub bench and pt at Minguard level. No cues needed to maintain hip precautions during session.  Pt planning to d/c home today. Pt will have assistance available at home upon d/c.      OT Assessment  Patient does not need any further OT services    Follow Up Recommendations  No OT follow up;Supervision/Assistance - 24 hour    Barriers to Discharge      Equipment Recommendations  Tub/shower bench;3 in 1 bedside comode    Recommendations for Other Services    Frequency       Precautions / Restrictions Precautions Precautions: Posterior Hip;Fall Precaution Booklet Issued: Yes (comment) Precaution Comments: Pt able to state 2/3 precautions. No cues needed for pt to maintain precautions in session. Restrictions Weight Bearing Restrictions: Yes RLE Weight Bearing: Weight bearing as tolerated   Pertinent Vitals/Pain No pain reported.     ADL  Eating/Feeding: Independent Where Assessed - Eating/Feeding: Chair Grooming: Supervision/safety Where Assessed - Grooming: Supported standing Upper Body Bathing: Set up Where Assessed - Upper Body Bathing: Unsupported sitting Lower Body Bathing: Min guard Where Assessed - Lower Body Bathing: Supported sit to stand Upper Body Dressing: Set up Where Assessed - Upper Body Dressing: Unsupported sitting Lower Body Dressing: Min guard Where Assessed - Lower Body Dressing: Supported sit to Pharmacist, hospital: Supervision/safety Statistician Method: Sit to Barista: Raised toilet seat with arms (or 3-in-1 over  toilet) Toileting - Clothing Manipulation and Hygiene: Min guard Where Assessed - Engineer, mining and Hygiene: Sit to stand from 3-in-1 or toilet Tub/Shower Transfer: Min guard Tub/Shower Transfer Method: Science writer: Counsellor Used: Rolling walker;Sock aid;Reacher;Long-handled shoe horn;Long-handled sponge Transfers/Ambulation Related to ADLs: Minguard for ambulation ADL Comments: Pt at [employer] level for LB ADLs with AE and supported sit to stand transfer.     OT Diagnosis:    OT Problem List:   OT Treatment Interventions:     OT Goals    Visit Information  Last OT Received On: 08/02/12 Assistance Needed: +1    Subjective Data      Prior Functioning     Home Living Lives With: Spouse Available Help at Discharge: Family;Available 24 hours/day Type of Home: House Home Access: Stairs to enter [employer] of Steps: 2 (2-10 pending approach he wants to attempt) Entrance Stairs-Rails: Right Home Layout: One level Bathroom Shower/Tub: Tub/shower unit;Curtain Firefighter: Handicapped height Bathroom Accessibility: Yes How Accessible: Accessible via walker Home Adaptive Equipment: None Prior Function Level of Independence: Independent Able to Take Stairs?: Yes Driving: Yes Vocation: Full time employment Comments: [employer]  Communication Communication: No difficulties Dominant Hand: Right         Vision/Perception     Cognition  Cognition Arousal/Alertness: Awake/alert Behavior During Therapy: WFL for tasks assessed/performed Overall Cognitive Status: Within Functional Limits for tasks assessed    Extremity/Trunk Assessment Right Upper Extremity Assessment RUE ROM/Strength/Tone: Within functional levels Left Upper Extremity Assessment LUE ROM/Strength/Tone: Within functional levels     Mobility Bed Mobility Bed Mobility: Not assessed Transfers Transfers: Sit to Stand;Stand to  Sit Sit  to Stand: With upper extremity assist;From chair/3-in-1;5: Supervision Stand to Sit: With upper extremity assist;To chair/3-in-1;5: Supervision Details for Transfer Assistance: no cues required.         Balance   End of Session OT - End of Session Activity Tolerance: Patient tolerated treatment well Patient left: in chair;with call bell/phone within reach;with family/visitor present Nurse Communication: Other (comment) (equipment needs)  GO     Earlie Raveling OTR/L [phone] 08/02/2012, 12:04 PM

## 2012-08-02 NOTE — Progress Notes (Signed)
Physical Therapy Treatment Patient Details Name: George Maldonado MRN: [id number] DOB: September 20, 1965 Today's Date: 08/02/2012 Time: 9147-8295 PT Time Calculation (min): 23 min  PT Assessment / Plan / Recommendation Comments on Treatment Session  Pt is progressing towards goals with therapy. Clear from PT standpoint to D/C home with family and HHPT.     Follow Up Recommendations  Home health PT;Supervision - Intermittent     Does the patient have the potential to tolerate intense rehabilitation     Barriers to Discharge        Equipment Recommendations  Rolling walker with 5" wheels;Other (comment) (Tall RW)    Recommendations for Other Services    Frequency 7X/week   Plan Discharge plan remains appropriate;Frequency remains appropriate    Precautions / Restrictions Precautions Precautions: Posterior Hip;Fall Precaution Booklet Issued: Yes (comment) Precaution Comments: Pt able to independently recall 2/3 hip precautions  Restrictions Weight Bearing Restrictions: Yes RLE Weight Bearing: Weight bearing as tolerated   Pertinent Vitals/Pain Denis pain; reports his R hip is "sore"; premedicated.     Mobility  Bed Mobility Bed Mobility: Not assessed Transfers Transfers: Sit to Stand;Stand to Sit Sit to Stand: From chair/3-in-1;With armrests;6: Modified independent (Device/Increase time) Stand to Sit: To chair/3-in-1;With armrests;6: Modified independent (Device/Increase time) Details for Transfer Assistance: no cues required; required increased time  Ambulation/Gait Ambulation/Gait Assistance: 5: Supervision Ambulation Distance (Feet): 200 Feet Assistive device: Rolling walker Ambulation/Gait Assistance Details: cont to amb with decreased step length on  L LE; unable to equalize WB; min cues for upright posture and gt sequencing  Gait Pattern: Decreased step length - left;Decreased stance time - right;Antalgic Gait velocity: decreased Stairs: Yes Stairs Assistance: 4:  Min guard;Other (comment) (Cues and handout given for sequencing and safety ) Stair Management Technique: No rails;Backwards;With walker Number of Stairs: 2 Wheelchair Mobility Wheelchair Mobility: No    Exercises Total Joint Exercises Ankle Circles/Pumps: AROM;Both;10 reps;Seated Quad Sets: AROM;Right;10 reps;Seated Hip ABduction/ADduction: AROM;Right;10 reps;Seated Long Arc Quad: AROM;Right;10 reps;Seated   PT Diagnosis:    PT Problem List:   PT Treatment Interventions:     PT Goals Acute Rehab PT Goals PT Goal Formulation: With patient Time For Goal Achievement: 08/06/12 Potential to Achieve Goals: Good PT Goal: Sit to Stand - Progress: Met PT Goal: Stand to Sit - Progress: Met PT Transfer Goal: Bed to Chair/Chair to Bed - Progress: Progressing toward goal PT Goal: Ambulate - Progress: Progressing toward goal PT Goal: Up/Down Stairs - Progress: Progressing toward goal PT Goal: Perform Home Exercise Program - Progress: Progressing toward goal Additional Goals PT Goal: Additional Goal #1 - Progress: Progressing toward goal  Visit Information  Last PT Received On: 08/02/12 Assistance Needed: +1    Subjective Data  Subjective: "I had an OK night. I am ready to go home as soon as we do steps"  Patient Stated Goal: home   Cognition  Cognition Arousal/Alertness: Awake/alert Behavior During Therapy: WFL for tasks assessed/performed Overall Cognitive Status: Within Functional Limits for tasks assessed    Balance  Balance Balance Assessed: No  End of Session PT - End of Session Equipment Utilized During Treatment: Gait belt Activity Tolerance: Patient tolerated treatment well Patient left: in chair;with call bell/phone within reach;with family/visitor present Nurse Communication: Mobility status;Other (comment) (DME needs)   GP     Donell Sievert, [place] [phone] 08/02/2012, 11:59 AM

## 2012-08-02 NOTE — Progress Notes (Signed)
Pt unable to void after foley removed at 0630. Patient complains of feeling the urge to void but not able to initiate stream. Bladder scan performed and In and out cath done with of urine. Will continue to monitor for spontaneous void before sending patient home.

## 2012-08-02 NOTE — Discharge Summary (Signed)
Patient ID: George Maldonado MRN: [id number] DOB/AGE: 47-16-1967 47 y.o.  Admit date: 08/01/2012 Discharge date: 08/02/2012  Admission Diagnoses:  Principal Problem:   Osteoarthritis of right hip   Discharge Diagnoses:  Same  Past Medical History  Diagnosis Date  . Hypertension   . Gout     Surgeries: Procedure(s): TOTAL HIP ARTHROPLASTY on 08/01/2012   Consultants:    Discharged Condition: Improved  Hospital Course: George Maldonado is an 47 y.o. male who was admitted 08/01/2012 for operative treatment ofOsteoarthritis of right hip. Patient has severe unremitting pain that affects sleep, daily activities, and work/hobbies. After pre-op clearance the patient was taken to the operating room on 08/01/2012 and underwent  Procedure(s): TOTAL HIP ARTHROPLASTY.  Patient ambulated 200 feet on the date of surgery, and has passed physical therapy goals prior to discharge. Dressing was changed prior to discharge.  Patient was given perioperative antibiotics: Anti-infectives   Start     Dose/Rate Route Frequency Ordered Stop   07/31/12 0948  ceFAZolin (ANCEF) 3 g in dextrose 5 % 50 mL IVPB  Status:  Discontinued     3 g 160 mL/hr over 30 Minutes Intravenous On call to O.R. 07/31/12 0948 08/01/12 1159       Patient was given sequential compression devices, early ambulation, and chemoprophylaxis to prevent DVT.  Patient benefited maximally from hospital stay and there were no complications.    Recent vital signs: Patient Vitals for the past 24 hrs:  BP Temp Temp src Pulse Resp SpO2  08/02/12 0600 94/60 mmHg 100.2 F (37.9 C) Oral 68 18 95 %  08/02/12 0451 107/55 mmHg - - - - -  08/02/12 0131 92/46 mmHg 99.4 F (37.4 C) Oral 80 18 96 %  08/01/12 2352 - 98.8 F (37.1 C) Oral - - -  08/01/12 2149 124/62 mmHg 99.9 F (37.7 C) Oral 73 18 94 %  08/01/12 1716 142/76 mmHg 98.9 F (37.2 C) - 89 18 100 %  08/01/12 1155 134/73 mmHg 99.2 F (37.3 C) - 90 18 100 %  08/01/12  1145 - - - 98 14 100 %  08/01/12 1135 - 97.9 F (36.6 C) - - - -  08/01/12 1129 145/77 mmHg - - 80 17 100 %  08/01/12 1114 153/69 mmHg - - 74 14 100 %  08/01/12 1059 154/75 mmHg - - 68 17 100 %  08/01/12 1044 155/84 mmHg - - 64 16 100 %  08/01/12 1029 160/92 mmHg - - 57 19 100 %  08/01/12 1014 136/104 mmHg - - 65 23 98 %  08/01/12 1000 - 99 F (37.2 C) - - - -  08/01/12 0959 148/92 mmHg - - 70 20 92 %     Recent laboratory studies:  Recent Labs  08/02/12 0550  WBC 8.5  HGB 9.8*  HCT 29.2*  PLT 156  NA 135  K 3.7  CL 100  CO2 30  BUN 13  CREATININE 1.29  GLUCOSE 113*  CALCIUM 8.1*     Discharge Medications:     Medication List    TAKE these medications       acetaminophen 500 MG tablet  Commonly known as:  TYLENOL  Take 1,000 mg by mouth 2 (two) times daily as needed for pain.     allopurinol 100 MG tablet  Commonly known as:  ZYLOPRIM  Take 100 mg by mouth daily as needed (for gout flare ups).     aspirin 325 MG EC tablet  Take 1  tablet (325 mg total) by mouth 2 (two) times daily.     meloxicam 15 MG tablet  Commonly known as:  MOBIC  Take 15 mg by mouth every evening.     methocarbamol 500 MG tablet  Commonly known as:  ROBAXIN  Take 1 tablet (500 mg total) by mouth every 6 (six) hours as needed.     oxyCODONE 5 MG immediate release tablet  Commonly known as:  Oxy IR/ROXICODONE  Take 1-2 tablets (5-10 mg total) by mouth every 3 (three) hours as needed.     simvastatin 20 MG tablet  Commonly known as:  ZOCOR  Take 20 mg by mouth every evening.     valsartan-hydrochlorothiazide 80-12.5 MG per tablet  Commonly known as:  DIOVAN-HCT  Take 1 tablet by mouth every evening.        Diagnostic Studies: Dg Chest 2 View  07/25/2012  *RADIOLOGY REPORT*  Clinical Data: Preoperative respiratory exam for right hip arthroplasty  CHEST - 2 VIEW  Comparison: 04/19/2088  Findings: Heart size is normal.  Mediastinal shadows are normal. Lungs are clear.  No  effusions.  No bony abnormalities.  IMPRESSION: Normal chest   Original Report Authenticated By: Paulina Fusi, M.D.    Dg Pelvis Portable  08/01/2012  *RADIOLOGY REPORT*  Clinical Data: 47 year old male status post right hip surgery.  PORTABLE PELVIS  Comparison: None.  Findings: Portable AP view 1015 hours.  Right bipolar hip arthroplasty.  Hardware appears intact and normally aligned on this AP view.  No unexpected osseous changes identified.  Visible pelvis intact.  IMPRESSION: Right hip arthroplasty with no adverse features identified.   Original Report Authenticated By: Erskine Speed, M.D.    Dg Hip Portable 1 View Right  08/01/2012  *RADIOLOGY REPORT*  Clinical Data: 47 year-old male status post right hip surgery.  PORTABLE RIGHT HIP - 1 VIEW  Comparison: 1015 hours the same day and earlier.  Findings: Two portable cross-table lateral views of the right hip demonstrate normal alignment of the femoral and acetabular components.  Hardware appears intact.  IMPRESSION: Right hip arthroplasty with no adverse features.   Original Report Authenticated By: Erskine Speed, M.D.     Disposition: Final discharge disposition not confirmed      Discharge Orders   Future Orders Complete By Expires     Call MD / Call 911  As directed     Comments:      If you experience chest pain or shortness of breath, CALL 911 and be transported to the hospital emergency room.  If you develope a fever above 101 F, pus (white drainage) or increased drainage or redness at the wound, or calf pain, call your surgeon's office.    Change dressing  As directed     Comments:      Please place new Mepilex dressing    Change dressing  As directed     Comments:      You may change your dressing on POD #5    Constipation Prevention  As directed     Comments:      Drink plenty of fluids.  Prune juice may be helpful.  You may use a stool softener, such as Colace (over the counter) 100 mg twice a day.  Use MiraLax (over the counter)  for constipation as needed.    Diet - low sodium heart healthy  As directed     Driving restrictions  As directed     Comments:  No driving for 2 weeks    Follow the hip precautions as taught in Physical Therapy  As directed     Increase activity slowly as tolerated  As directed     Patient may shower  As directed     Comments:      You may shower without a dressing once there is no drainage.  Do not wash over the wound.  If drainage remains, cover wound with plastic wrap and then shower.       Follow-up Information   Follow up with Nestor Lewandowsky, MD In 2 weeks.   Contact information:   [address] [place] [address] [phone]        Signed: Nestor Lewandowsky 08/02/2012, 7:06 AM

## 2015-11-05 ENCOUNTER — Ambulatory Visit (INDEPENDENT_AMBULATORY_CARE_PROVIDER_SITE_OTHER): Payer: 59 | Admitting: Orthopaedic Surgery

## 2015-11-05 ENCOUNTER — Encounter: Payer: Self-pay | Admitting: Orthopaedic Surgery

## 2015-11-05 ENCOUNTER — Ambulatory Visit (HOSPITAL_COMMUNITY)
Admission: RE | Admit: 2015-11-05 | Discharge: 2015-11-05 | Disposition: A | Discharge status: Home / Self Care | Payer: 59 | Source: Ambulatory Visit | Attending: Orthopaedic Surgery | Admitting: Orthopaedic Surgery

## 2015-11-05 VITALS — BP 132/78 | HR 57 | Temp 97.7°F | Ht 79.0 in | Wt 295.0 lb

## 2015-11-05 DIAGNOSIS — M25552 Pain in left hip: Secondary | ICD-10-CM

## 2015-11-05 DIAGNOSIS — M25511 Pain in right shoulder: Secondary | ICD-10-CM | POA: Diagnosis not present

## 2015-11-05 MED ORDER — PREDNISONE 5 MG (21) PO TBPK: ORAL_TABLET | ORAL | 0 refills | Status: DC
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
## 2015-11-05 NOTE — Patient Instructions (Signed)
Continue current medications.  Precautions discussed.

## 2016-01-23 ENCOUNTER — Other Ambulatory Visit (HOSPITAL_COMMUNITY): Payer: Self-pay | Admitting: Orthopedic Surgery

## 2016-01-23 DIAGNOSIS — Z96641 Presence of right artificial hip joint: Secondary | ICD-10-CM

## 2016-01-31 ENCOUNTER — Encounter (HOSPITAL_COMMUNITY)
Admission: RE | Admit: 2016-01-31 | Discharge: 2016-01-31 | Disposition: A | Discharge status: Home / Self Care | Payer: 59 | Source: Ambulatory Visit | Attending: Orthopedic Surgery | Admitting: Orthopedic Surgery

## 2016-01-31 DIAGNOSIS — Z96641 Presence of right artificial hip joint: Secondary | ICD-10-CM | POA: Diagnosis present

## 2016-01-31 MED ORDER — TECHNETIUM TC 99M MEDRONATE IV KIT
21.2000 | PACK | Freq: Once | INTRAVENOUS | Status: AC | PRN
  Administered 2016-01-31: 21.2 via INTRAVENOUS

## 2016-06-19 DIAGNOSIS — M5442 Lumbago with sciatica, left side: Secondary | ICD-10-CM | POA: Diagnosis not present

## 2016-06-19 DIAGNOSIS — M9903 Segmental and somatic dysfunction of lumbar region: Secondary | ICD-10-CM | POA: Diagnosis not present

## 2016-06-19 DIAGNOSIS — M9905 Segmental and somatic dysfunction of pelvic region: Secondary | ICD-10-CM | POA: Diagnosis not present

## 2016-06-22 DIAGNOSIS — M9903 Segmental and somatic dysfunction of lumbar region: Secondary | ICD-10-CM | POA: Diagnosis not present

## 2016-06-22 DIAGNOSIS — M5442 Lumbago with sciatica, left side: Secondary | ICD-10-CM | POA: Diagnosis not present

## 2016-06-22 DIAGNOSIS — M9905 Segmental and somatic dysfunction of pelvic region: Secondary | ICD-10-CM | POA: Diagnosis not present

## 2016-07-07 DIAGNOSIS — E782 Mixed hyperlipidemia: Secondary | ICD-10-CM | POA: Diagnosis not present

## 2016-07-07 DIAGNOSIS — Z1389 Encounter for screening for other disorder: Secondary | ICD-10-CM | POA: Diagnosis not present

## 2016-07-07 DIAGNOSIS — I10 Essential (primary) hypertension: Secondary | ICD-10-CM | POA: Diagnosis not present

## 2016-07-13 ENCOUNTER — Encounter (INDEPENDENT_AMBULATORY_CARE_PROVIDER_SITE_OTHER): Payer: Self-pay | Admitting: *Deleted

## 2016-08-13 DIAGNOSIS — M1612 Unilateral primary osteoarthritis, left hip: Secondary | ICD-10-CM | POA: Diagnosis not present

## 2016-08-13 DIAGNOSIS — M545 Low back pain: Secondary | ICD-10-CM | POA: Diagnosis not present

## 2016-10-19 ENCOUNTER — Other Ambulatory Visit: Payer: Self-pay | Admitting: Orthopedic Surgery

## 2016-10-21 NOTE — Pre-Procedure Instructions (Signed)
Sender Rueb Chance  10/21/2016      [id number] [place], [place] [phone] [facility] [place] [address] [address] Phone: [phone] Fax: [phone]    Your procedure is scheduled on November 02, 2016  Report to [facility] Admitting at 530 AM.  Call this number if you have problems the morning of surgery:  [phone]   Remember:  Do not eat food or drink liquids after midnight.  Take these medicines the morning of surgery with A SIP OF WATER allopurinol (zyloprim), levocetirizine (xyzal), eye drops.  7 days prior to surgery STOP taking any Mobic, Aspirin, Aleve, Naproxen, Ibuprofen, Motrin, Advil, Goody's, BC's, all herbal medications, fish oil, and all vitamins   Do not wear jewelry, make-up or nail polish.  Do not wear lotions, powders, or perfumes, or deoderant.  Men may shave face and neck.  Do not bring valuables to the hospital.  [facility] is not responsible for any belongings or valuables.  Contacts, dentures or bridgework may not be worn into surgery.  Leave your suitcase in the car.  After surgery it may be brought to your room.  For patients admitted to the hospital, discharge time will be determined by your treatment team.  Patients discharged the day of surgery will not be allowed to drive home.   Special instructions:   [facility]- Preparing For Surgery  Before surgery, you can play an important role. Because skin is not sterile, your skin needs to be as free of germs as possible. You can reduce the number of germs on your skin by washing with CHG (chlorahexidine gluconate) Soap before surgery.  CHG is an antiseptic cleaner which kills germs and bonds with the skin to continue killing germs even after washing.  Please do not use if you have an allergy to CHG or antibacterial soaps. If your skin becomes reddened/irritated stop using the CHG.  Do not shave (including legs and underarms) for at  least 48 hours prior to first CHG shower. It is OK to shave your face.  Please follow these instructions carefully.   1. Shower the NIGHT BEFORE SURGERY and the MORNING OF SURGERY with CHG.   2. If you chose to wash your hair, wash your hair first as usual with your normal shampoo.  3. After you shampoo, rinse your hair and body thoroughly to remove the shampoo.  4. Use CHG as you would any other liquid soap. You can apply CHG directly to the skin and wash gently with a scrungie or a clean washcloth.   5. Apply the CHG Soap to your body ONLY FROM THE NECK DOWN.  Do not use on open wounds or open sores. Avoid contact with your eyes, ears, mouth and genitals (private parts). Wash genitals (private parts) with your normal soap.  6. Wash thoroughly, paying special attention to the area where your surgery will be performed.  7. Thoroughly rinse your body with warm water from the neck down.  8. DO NOT shower/wash with your normal soap after using and rinsing off the CHG Soap.  9. Pat yourself dry with a CLEAN TOWEL.   10. Wear CLEAN PAJAMAS   11. Place CLEAN SHEETS on your bed the night of your first shower and DO NOT SLEEP WITH PETS.    Day of Surgery: Do not apply any deodorants/lotions. Please wear clean clothes to the hospital/surgery center.     Please read over the  following fact sheets that you were given. Pain Booklet, Coughing and Deep Breathing, MRSA Information and Surgical Site Infection Prevention

## 2016-10-22 ENCOUNTER — Encounter (HOSPITAL_COMMUNITY)
Admission: RE | Admit: 2016-10-22 | Discharge: 2016-10-22 | Disposition: A | Discharge status: Home / Self Care | Payer: 59 | Source: Ambulatory Visit | Attending: Orthopedic Surgery | Admitting: Orthopedic Surgery

## 2016-10-22 ENCOUNTER — Ambulatory Visit (HOSPITAL_COMMUNITY)
Admission: RE | Admit: 2016-10-22 | Discharge: 2016-10-22 | Disposition: A | Discharge status: Home / Self Care | Payer: 59 | Source: Ambulatory Visit | Attending: Orthopedic Surgery | Admitting: Orthopedic Surgery

## 2016-10-22 ENCOUNTER — Encounter (HOSPITAL_COMMUNITY): Payer: Self-pay

## 2016-10-22 DIAGNOSIS — R001 Bradycardia, unspecified: Secondary | ICD-10-CM | POA: Diagnosis not present

## 2016-10-22 DIAGNOSIS — Z01810 Encounter for preprocedural cardiovascular examination: Secondary | ICD-10-CM | POA: Diagnosis present

## 2016-10-22 DIAGNOSIS — M1612 Unilateral primary osteoarthritis, left hip: Secondary | ICD-10-CM | POA: Diagnosis not present

## 2016-10-22 DIAGNOSIS — Z01812 Encounter for preprocedural laboratory examination: Secondary | ICD-10-CM | POA: Diagnosis present

## 2016-10-22 DIAGNOSIS — Z01818 Encounter for other preprocedural examination: Secondary | ICD-10-CM

## 2016-10-22 HISTORY — DX: Unspecified osteoarthritis, unspecified site: M19.90

## 2016-10-22 HISTORY — DX: Gastro-esophageal reflux disease without esophagitis: K21.9

## 2016-10-22 HISTORY — DX: Cardiac murmur, unspecified: R01.1

## 2016-10-22 LAB — BASIC METABOLIC PANEL
ANION GAP: 8 (ref 5–15)
BUN: 12 mg/dL (ref 6–20)
CALCIUM: 9.2 mg/dL (ref 8.9–10.3)
CO2: 25 mmol/L (ref 22–32)
Chloride: 106 mmol/L (ref 101–111)
Creatinine, Ser: 1.16 mg/dL (ref 0.61–1.24)
GFR calc Af Amer: >60 mL/min (ref >60)
Glucose, Bld: 92 mg/dL (ref 65–99)
POTASSIUM: 3.6 mmol/L (ref 3.5–5.1)
SODIUM: 139 mmol/L (ref 135–145)

## 2016-10-22 LAB — SURGICAL PCR SCREEN
MRSA, PCR: NEGATIVE
Staphylococcus aureus: NEGATIVE

## 2016-10-22 LAB — TYPE AND SCREEN
ABO/RH(D): O POS
ANTIBODY SCREEN: NEGATIVE

## 2016-10-22 LAB — URINALYSIS, ROUTINE W REFLEX MICROSCOPIC
BILIRUBIN URINE: NEGATIVE
GLUCOSE, UA: NEGATIVE mg/dL
HGB URINE DIPSTICK: NEGATIVE
Ketones, ur: NEGATIVE mg/dL
Leukocytes, UA: NEGATIVE
Nitrite: NEGATIVE
PH: 5 (ref 5.0–8.0)
Protein, ur: NEGATIVE mg/dL
SPECIFIC GRAVITY, URINE: 1.018 (ref 1.005–1.030)

## 2016-10-22 LAB — CBC WITH DIFFERENTIAL/PLATELET
BASOS ABS: 0 10*3/uL (ref 0.0–0.1)
BASOS PCT: 0 %
EOS PCT: 2 %
Eosinophils Absolute: 0.2 10*3/uL (ref 0.0–0.7)
HCT: 41.2 % (ref 39.0–52.0)
Hemoglobin: 13.6 g/dL (ref 13.0–17.0)
LYMPHS PCT: 27 %
Lymphs Abs: 2.6 10*3/uL (ref 0.7–4.0)
MCH: 27.9 pg (ref 26.0–34.0)
MCHC: 33 g/dL (ref 30.0–36.0)
MCV: 84.6 fL (ref 78.0–100.0)
Monocytes Absolute: 0.8 10*3/uL (ref 0.1–1.0)
Monocytes Relative: 8 %
Neutro Abs: 6 10*3/uL (ref 1.7–7.7)
Neutrophils Relative %: 63 %
PLATELETS: 206 10*3/uL (ref 150–400)
RBC: 4.87 MIL/uL (ref 4.22–5.81)
RDW: 13.6 % (ref 11.5–15.5)
WBC: 9.6 10*3/uL (ref 4.0–10.5)

## 2016-10-22 LAB — PROTIME-INR
INR: 1.07
PROTHROMBIN TIME: 14 s (ref 11.4–15.2)

## 2016-10-22 LAB — APTT: APTT: 36 s (ref 24–36)

## 2016-10-22 NOTE — Progress Notes (Signed)
PCP: dr. Armandina Gemma @ [facility] in [place] echo/stress/ekg/notes.  No cardiologist

## 2016-10-30 DIAGNOSIS — M1612 Unilateral primary osteoarthritis, left hip: Secondary | ICD-10-CM | POA: Diagnosis present

## 2016-10-30 MED ORDER — TRANEXAMIC ACID 1000 MG/10ML IV SOLN
2000.0000 mg | INTRAVENOUS | Status: AC
  Administered 2016-11-02: 2000 mg via TOPICAL
  Filled 2016-10-30: qty 20

## 2016-10-30 MED ORDER — CEFAZOLIN SODIUM 10 G IJ SOLR
3.0000 g | INTRAMUSCULAR | Status: AC
  Administered 2016-11-02: 3 g via INTRAVENOUS
  Filled 2016-10-30: qty 3000

## 2016-10-30 NOTE — Progress Notes (Signed)
Called office about medical records that were requested on 10/22/16.  Left message with office

## 2016-10-30 NOTE — H&P (Signed)
TOTAL HIP ADMISSION H&P  Patient is admitted for left total hip arthroplasty.  Subjective:  Chief Complaint: left hip pain  HPI: George Maldonado, 51 y.o. male, has a history of pain and functional disability in the left hip(s) due to arthritis and patient has failed non-surgical conservative treatments for greater than 12 weeks to include NSAID's and/or analgesics, corticosteriod injections, flexibility and strengthening excercises, weight reduction as appropriate and activity modification.  Onset of symptoms was gradual starting 2 years ago with gradually worsening course since that time.The patient noted no past surgery on the left hip(s).  Patient currently rates pain in the left hip at 10 out of 10 with activity. Patient has night pain, worsening of pain with activity and weight bearing, pain that interfers with activities of daily living and pain with passive range of motion. Patient has evidence of subchondral cysts, periarticular osteophytes and joint space narrowing by imaging studies. This condition presents safety issues increasing the risk of falls.  There is no current active infection.  Patient Active Problem List   Diagnosis Date Noted  . Osteoarthritis of right hip 08/02/2012   Past Medical History:  Diagnosis Date  . Arthritis   . GERD (gastroesophageal reflux disease)    occasionally  . Gout   . Heart murmur    as a child  . Hypertension     Past Surgical History:  Procedure Laterality Date  . NO PAST SURGERIES    . TOTAL HIP ARTHROPLASTY Right 08/01/2012   Procedure: TOTAL HIP ARTHROPLASTY;  Surgeon: Kerin Salen, MD;  Location: [place];  Service: Orthopedics;  Laterality: Right;  DEPUY, NEEDS LONG TABLE PATIENT 6'8" 290 LBS    No prescriptions prior to admission.   No Known Allergies  Social History  Substance Use Topics  . Smoking status: Never Smoker  . Smokeless tobacco: Never Used  . Alcohol use No    No family history on file.   Review of Systems   Constitutional: Negative.   HENT: Negative.   Eyes: Negative.   Respiratory: Negative.   Cardiovascular:       HTN  Gastrointestinal: Negative.   Genitourinary: Negative.   Musculoskeletal: Positive for joint pain.  Skin: Negative.   Neurological: Negative.   Endo/Heme/Allergies: Negative.   Psychiatric/Behavioral: Negative.     Objective:  Physical Exam  Constitutional: He is oriented to person, place, and time. He appears well-developed and well-nourished.  HENT:  Head: Normocephalic and atraumatic.  Eyes: Pupils are equal, round, and reactive to light.  Neck: Normal range of motion. Neck supple.  Cardiovascular: Intact distal pulses.   Respiratory: Effort normal.  Musculoskeletal: He exhibits tenderness.  Limited range of motion of the left hip.  Significantly decreased range of motion in external and internal right rotation.  Left hip pain reproducible with hip flexion.  No tenderness to palpation of the greater trochanter.  Notable limping gait. No acute abnormalities of the right hip.    Neurological: He is alert and oriented to person, place, and time.  Skin: Skin is warm and dry.  Psychiatric: He has a normal mood and affect. His behavior is normal. Judgment and thought content normal.    Vital signs in last 24 hours:    Labs:   Estimated body mass index is 33.15 kg/m as calculated from the following:   Height as of 10/22/16: 6\' 8"  (2.032 m).   Weight as of 10/22/16: 136.9 kg (301 lb 12.8 oz).   Imaging Review Plain radiographs demonstrate AP  pelvis and crosstable lateral of both hips shows 1 mm cartilage remaining to the left hip, super acetabular cyst near the superior rim has gotten slightly larger at 1 cm since 2014.  The right total hip is well placed and well fixed.  No overt evidence of loosening.    Assessment/Plan:  End stage arthritis, left hip(s)  The patient history, physical examination, clinical judgement of the provider and imaging studies are  consistent with end stage degenerative joint disease of the left hip(s) and total hip arthroplasty is deemed medically necessary. The treatment options including medical management, injection therapy, arthroscopy and arthroplasty were discussed at length. The risks and benefits of total hip arthroplasty were presented and reviewed. The risks due to aseptic loosening, infection, stiffness, dislocation/subluxation,  thromboembolic complications and other imponderables were discussed.  The patient acknowledged the explanation, agreed to proceed with the plan and consent was signed. Patient is being admitted for inpatient treatment for surgery, pain control, PT, OT, prophylactic antibiotics, VTE prophylaxis, progressive ambulation and ADL's and discharge planning.The patient is planning to be discharged home with home health services.

## 2016-11-01 ENCOUNTER — Encounter (HOSPITAL_COMMUNITY): Payer: Self-pay | Admitting: Anesthesiology

## 2016-11-01 NOTE — Anesthesia Preprocedure Evaluation (Addendum)
Anesthesia Evaluation  Patient identified by MRN, date of birth, ID band Patient awake    Reviewed: Allergy & Precautions, NPO status , Patient's Chart, lab work & pertinent test results  Airway Mallampati: II       Dental  (+) Teeth Intact, Dental Advisory Given   Pulmonary neg pulmonary ROS,    breath sounds clear to auscultation       Cardiovascular hypertension, Pt. on medications negative cardio ROS   Rhythm:Regular Rate:Bradycardia     Neuro/Psych negative neurological ROS     GI/Hepatic Neg liver ROS, GERD  ,  Endo/Other  negative endocrine ROS  Renal/GU negative Renal ROS     Musculoskeletal  (+) Arthritis , Osteoarthritis,    Abdominal   Peds  Hematology negative hematology ROS (+)   Anesthesia Other Findings Day of surgery medications reviewed with the patient.  Reproductive/Obstetrics                            Lab Results  Component Value Date   WBC 9.6 10/22/2016   HGB 13.6 10/22/2016   HCT 41.2 10/22/2016   MCV 84.6 10/22/2016   PLT 206 10/22/2016   EKG: sinus bradycardia.   Anesthesia Physical Anesthesia Plan  ASA: II  Anesthesia Plan: Spinal   Post-op Pain Management:    Induction:   PONV Risk Score and Plan: 2 and Ondansetron, Dexamethasone and Propofol infusion  Airway Management Planned: Nasal Cannula  Additional Equipment:   Intra-op Plan:   Post-operative Plan:   Informed Consent: I have reviewed the patients History and Physical, chart, labs and discussed the procedure including the risks, benefits and alternatives for the proposed anesthesia with the patient or authorized representative who has indicated his/her understanding and acceptance.     Plan Discussed with:   Anesthesia Plan Comments:         Anesthesia Quick Evaluation

## 2016-11-02 ENCOUNTER — Inpatient Hospital Stay (HOSPITAL_COMMUNITY): Payer: 59 | Admitting: Anesthesiology

## 2016-11-02 ENCOUNTER — Inpatient Hospital Stay (HOSPITAL_COMMUNITY)
Admission: RE | Admit: 2016-11-02 | Discharge: 2016-11-04 | DRG: 470 | Disposition: A | Discharge status: Home / Self Care | Payer: 59 | Source: Ambulatory Visit | Attending: Orthopedic Surgery | Admitting: Orthopedic Surgery

## 2016-11-02 ENCOUNTER — Inpatient Hospital Stay (HOSPITAL_COMMUNITY): Payer: 59

## 2016-11-02 ENCOUNTER — Encounter (HOSPITAL_COMMUNITY)
Admission: RE | Disposition: A | Discharge status: Home / Self Care | Payer: Self-pay | Source: Ambulatory Visit | Attending: Orthopedic Surgery

## 2016-11-02 DIAGNOSIS — M109 Gout, unspecified: Secondary | ICD-10-CM | POA: Diagnosis present

## 2016-11-02 DIAGNOSIS — K219 Gastro-esophageal reflux disease without esophagitis: Secondary | ICD-10-CM | POA: Diagnosis not present

## 2016-11-02 DIAGNOSIS — Z96641 Presence of right artificial hip joint: Secondary | ICD-10-CM | POA: Diagnosis present

## 2016-11-02 DIAGNOSIS — I10 Essential (primary) hypertension: Secondary | ICD-10-CM | POA: Diagnosis present

## 2016-11-02 DIAGNOSIS — M1612 Unilateral primary osteoarthritis, left hip: Secondary | ICD-10-CM | POA: Diagnosis not present

## 2016-11-02 DIAGNOSIS — D62 Acute posthemorrhagic anemia: Secondary | ICD-10-CM | POA: Diagnosis not present

## 2016-11-02 DIAGNOSIS — Z96642 Presence of left artificial hip joint: Secondary | ICD-10-CM | POA: Diagnosis not present

## 2016-11-02 DIAGNOSIS — Z471 Aftercare following joint replacement surgery: Secondary | ICD-10-CM | POA: Diagnosis not present

## 2016-11-02 DIAGNOSIS — Z419 Encounter for procedure for purposes other than remedying health state, unspecified: Secondary | ICD-10-CM

## 2016-11-02 HISTORY — PX: TOTAL HIP ARTHROPLASTY: SHX124

## 2016-11-02 SURGERY — ARTHROPLASTY, HIP, TOTAL, ANTERIOR APPROACH
Anesthesia: Spinal | Site: Hip | Laterality: Left

## 2016-11-02 MED ORDER — ACETAMINOPHEN 325 MG PO TABS
650.0000 mg | ORAL_TABLET | Freq: Four times a day (QID) | ORAL | Status: DC | PRN
  Administered 2016-11-02: 650 mg via ORAL
  Filled 2016-11-02: qty 2

## 2016-11-02 MED ORDER — PHENOL 1.4 % MT LIQD: 1.0000 | OROMUCOSAL | Status: DC | PRN

## 2016-11-02 MED ORDER — METHOCARBAMOL 1000 MG/10ML IJ SOLN
500.0000 mg | Freq: Four times a day (QID) | INTRAVENOUS | Status: DC | PRN
  Filled 2016-11-02: qty 5

## 2016-11-02 MED ORDER — ONDANSETRON HCL 4 MG PO TABS: 4.0000 mg | ORAL_TABLET | Freq: Four times a day (QID) | ORAL | Status: DC | PRN

## 2016-11-02 MED ORDER — ALUMINUM HYDROXIDE GEL 320 MG/5ML PO SUSP
15.0000 mL | ORAL | Status: DC | PRN
  Administered 2016-11-03: 30 mL via ORAL
  Filled 2016-11-02: qty 30

## 2016-11-02 MED ORDER — ASPIRIN 325 MG PO TBEC: 325.0000 mg | DELAYED_RELEASE_TABLET | Freq: Two times a day (BID) | ORAL | 30 refills | Status: DC

## 2016-11-02 MED ORDER — ONDANSETRON HCL 4 MG/2ML IJ SOLN
INTRAMUSCULAR | Status: DC | PRN
  Administered 2016-11-02: 4 mg via INTRAVENOUS

## 2016-11-02 MED ORDER — LACTATED RINGERS IV SOLN: INTRAVENOUS | Status: DC

## 2016-11-02 MED ORDER — LACTATED RINGERS IV SOLN
INTRAVENOUS | Status: DC | PRN
  Administered 2016-11-02 (×2): via INTRAVENOUS

## 2016-11-02 MED ORDER — HYDROMORPHONE HCL 1 MG/ML IJ SOLN
0.5000 mg | INTRAMUSCULAR | Status: DC | PRN
  Administered 2016-11-02 – 2016-11-03 (×5): 1 mg via INTRAVENOUS
  Filled 2016-11-02 (×5): qty 1

## 2016-11-02 MED ORDER — PROPOFOL 500 MG/50ML IV EMUL
INTRAVENOUS | Status: DC | PRN
  Administered 2016-11-02: 50 ug/kg/min via INTRAVENOUS

## 2016-11-02 MED ORDER — METOCLOPRAMIDE HCL 5 MG/ML IJ SOLN: 5.0000 mg | Freq: Three times a day (TID) | INTRAMUSCULAR | Status: DC | PRN

## 2016-11-02 MED ORDER — PROPOFOL 10 MG/ML IV BOLUS
INTRAVENOUS | Status: AC
  Filled 2016-11-02: qty 20

## 2016-11-02 MED ORDER — ACETAMINOPHEN 650 MG RE SUPP: 650.0000 mg | Freq: Four times a day (QID) | RECTAL | Status: DC | PRN

## 2016-11-02 MED ORDER — DEXTROSE-NACL 5-0.45 % IV SOLN: INTRAVENOUS | Status: DC

## 2016-11-02 MED ORDER — PROPOFOL 500 MG/50ML IV EMUL
INTRAVENOUS | Status: AC
  Filled 2016-11-02: qty 50

## 2016-11-02 MED ORDER — HYDROMORPHONE HCL 1 MG/ML IJ SOLN
0.2500 mg | INTRAMUSCULAR | Status: DC | PRN
  Administered 2016-11-02 (×2): 0.5 mg via INTRAVENOUS

## 2016-11-02 MED ORDER — DEXAMETHASONE SODIUM PHOSPHATE 10 MG/ML IJ SOLN
INTRAMUSCULAR | Status: DC | PRN
  Administered 2016-11-02: 10 mg via INTRAVENOUS

## 2016-11-02 MED ORDER — MENTHOL 3 MG MT LOZG: 1.0000 | LOZENGE | OROMUCOSAL | Status: DC | PRN

## 2016-11-02 MED ORDER — CHLORHEXIDINE GLUCONATE 4 % EX LIQD: 60.0000 mL | Freq: Once | CUTANEOUS | Status: DC

## 2016-11-02 MED ORDER — ASPIRIN EC 325 MG PO TBEC
325.0000 mg | DELAYED_RELEASE_TABLET | Freq: Every day | ORAL | Status: DC
  Administered 2016-11-03 – 2016-11-04 (×2): 325 mg via ORAL
  Filled 2016-11-02 (×2): qty 1

## 2016-11-02 MED ORDER — HYDROMORPHONE HCL 1 MG/ML IJ SOLN
INTRAMUSCULAR | Status: AC
  Filled 2016-11-02: qty 1

## 2016-11-02 MED ORDER — MEPERIDINE HCL 25 MG/ML IJ SOLN: 6.2500 mg | INTRAMUSCULAR | Status: DC | PRN

## 2016-11-02 MED ORDER — INDOMETHACIN 50 MG PO CAPS
50.0000 mg | ORAL_CAPSULE | Freq: Three times a day (TID) | ORAL | Status: DC | PRN
  Filled 2016-11-02: qty 1

## 2016-11-02 MED ORDER — KCL IN DEXTROSE-NACL 20-5-0.45 MEQ/L-%-% IV SOLN
INTRAVENOUS | Status: DC
  Administered 2016-11-02 – 2016-11-03 (×2): via INTRAVENOUS
  Filled 2016-11-02 (×2): qty 1000

## 2016-11-02 MED ORDER — POLYETHYL GLYCOL-PROPYL GLYCOL 0.4-0.3 % OP SOLN
1.0000 [drp] | Freq: Two times a day (BID) | OPHTHALMIC | Status: DC | PRN
Start: 2016-11-02 — End: 2016-11-02

## 2016-11-02 MED ORDER — FLEET ENEMA 7-19 GM/118ML RE ENEM: 1.0000 | ENEMA | Freq: Once | RECTAL | Status: DC | PRN

## 2016-11-02 MED ORDER — METHOCARBAMOL 500 MG PO TABS
500.0000 mg | ORAL_TABLET | Freq: Four times a day (QID) | ORAL | Status: DC | PRN
  Administered 2016-11-02 – 2016-11-03 (×4): 500 mg via ORAL
  Filled 2016-11-02 (×4): qty 1

## 2016-11-02 MED ORDER — SODIUM CHLORIDE 0.9 % IV SOLN
INTRAVENOUS | Status: DC | PRN
  Administered 2016-11-02 (×2): via INTRAVENOUS

## 2016-11-02 MED ORDER — BISACODYL 5 MG PO TBEC: 5.0000 mg | DELAYED_RELEASE_TABLET | Freq: Every day | ORAL | Status: DC | PRN

## 2016-11-02 MED ORDER — FENTANYL CITRATE (PF) 250 MCG/5ML IJ SOLN
INTRAMUSCULAR | Status: AC
  Filled 2016-11-02: qty 5

## 2016-11-02 MED ORDER — POLYVINYL ALCOHOL 1.4 % OP SOLN
1.0000 [drp] | Freq: Two times a day (BID) | OPHTHALMIC | Status: DC | PRN
  Filled 2016-11-02: qty 15

## 2016-11-02 MED ORDER — LORATADINE 10 MG PO TABS: 10.0000 mg | ORAL_TABLET | Freq: Every day | ORAL | Status: DC | PRN

## 2016-11-02 MED ORDER — BUPIVACAINE-EPINEPHRINE (PF) 0.5% -1:200000 IJ SOLN
INTRAMUSCULAR | Status: DC | PRN
  Administered 2016-11-02: 30 mL

## 2016-11-02 MED ORDER — ONDANSETRON HCL 4 MG/2ML IJ SOLN: 4.0000 mg | Freq: Four times a day (QID) | INTRAMUSCULAR | Status: DC | PRN

## 2016-11-02 MED ORDER — 0.9 % SODIUM CHLORIDE (POUR BTL) OPTIME
TOPICAL | Status: DC | PRN
  Administered 2016-11-02: 1000 mL

## 2016-11-02 MED ORDER — BUPIVACAINE LIPOSOME 1.3 % IJ SUSP
20.0000 mL | Freq: Once | INTRAMUSCULAR | Status: DC
  Filled 2016-11-02: qty 20

## 2016-11-02 MED ORDER — GLYCOPYRROLATE 0.2 MG/ML IJ SOLN
INTRAMUSCULAR | Status: DC | PRN
  Administered 2016-11-02: 0.2 mg via INTRAVENOUS

## 2016-11-02 MED ORDER — DOCUSATE SODIUM 100 MG PO CAPS
100.0000 mg | ORAL_CAPSULE | Freq: Two times a day (BID) | ORAL | Status: DC
  Administered 2016-11-02 – 2016-11-04 (×5): 100 mg via ORAL
  Filled 2016-11-02 (×5): qty 1

## 2016-11-02 MED ORDER — DIPHENHYDRAMINE HCL 12.5 MG/5ML PO ELIX: 12.5000 mg | ORAL_SOLUTION | ORAL | Status: DC | PRN

## 2016-11-02 MED ORDER — ALLOPURINOL 300 MG PO TABS
300.0000 mg | ORAL_TABLET | Freq: Every evening | ORAL | Status: DC
  Administered 2016-11-02 – 2016-11-03 (×2): 300 mg via ORAL
  Filled 2016-11-02 (×2): qty 1

## 2016-11-02 MED ORDER — BUPIVACAINE LIPOSOME 1.3 % IJ SUSP
INTRAMUSCULAR | Status: DC | PRN
  Administered 2016-11-02: 20 mL

## 2016-11-02 MED ORDER — DEXAMETHASONE SODIUM PHOSPHATE 10 MG/ML IJ SOLN
10.0000 mg | Freq: Once | INTRAMUSCULAR | Status: AC
  Administered 2016-11-03: 10 mg via INTRAVENOUS
  Filled 2016-11-02: qty 1

## 2016-11-02 MED ORDER — PROPOFOL 1000 MG/100ML IV EMUL
INTRAVENOUS | Status: AC
  Filled 2016-11-02: qty 100

## 2016-11-02 MED ORDER — TRANEXAMIC ACID 1000 MG/10ML IV SOLN
1000.0000 mg | Freq: Once | INTRAVENOUS | Status: AC
  Administered 2016-11-02: 1000 mg via INTRAVENOUS
  Filled 2016-11-02: qty 10

## 2016-11-02 MED ORDER — HYDROCHLOROTHIAZIDE 12.5 MG PO CAPS
12.5000 mg | ORAL_CAPSULE | Freq: Every day | ORAL | Status: DC
  Administered 2016-11-03 – 2016-11-04 (×2): 12.5 mg via ORAL
  Filled 2016-11-02 (×3): qty 1

## 2016-11-02 MED ORDER — FENTANYL CITRATE (PF) 100 MCG/2ML IJ SOLN
INTRAMUSCULAR | Status: DC | PRN
  Administered 2016-11-02 (×2): 50 ug via INTRAVENOUS

## 2016-11-02 MED ORDER — METHOCARBAMOL 500 MG PO TABS: 500.0000 mg | ORAL_TABLET | Freq: Two times a day (BID) | ORAL | 0 refills | Status: DC

## 2016-11-02 MED ORDER — PROMETHAZINE HCL 25 MG/ML IJ SOLN: 6.2500 mg | INTRAMUSCULAR | Status: DC | PRN

## 2016-11-02 MED ORDER — OXYCODONE HCL 5 MG PO TABS
5.0000 mg | ORAL_TABLET | ORAL | Status: DC | PRN
  Administered 2016-11-02 (×3): 10 mg via ORAL
  Administered 2016-11-03: 5 mg via ORAL
  Administered 2016-11-03 – 2016-11-04 (×6): 10 mg via ORAL
  Filled 2016-11-02 (×10): qty 2

## 2016-11-02 MED ORDER — MIDAZOLAM HCL 5 MG/5ML IJ SOLN
INTRAMUSCULAR | Status: DC | PRN
  Administered 2016-11-02: 2 mg via INTRAVENOUS

## 2016-11-02 MED ORDER — POLYETHYLENE GLYCOL 3350 17 G PO PACK: 17.0000 g | PACK | Freq: Every day | ORAL | Status: DC | PRN

## 2016-11-02 MED ORDER — OXYCODONE-ACETAMINOPHEN 5-325 MG PO TABS: 1.0000 | ORAL_TABLET | ORAL | 0 refills | Status: DC | PRN

## 2016-11-02 MED ORDER — IRBESARTAN 150 MG PO TABS
75.0000 mg | ORAL_TABLET | Freq: Every day | ORAL | Status: DC
  Administered 2016-11-03 – 2016-11-04 (×2): 75 mg via ORAL
  Filled 2016-11-02 (×3): qty 1

## 2016-11-02 MED ORDER — SODIUM CHLORIDE 0.9 % IJ SOLN
INTRAMUSCULAR | Status: DC | PRN
  Administered 2016-11-02: 50 mL

## 2016-11-02 MED ORDER — SIMVASTATIN 20 MG PO TABS
20.0000 mg | ORAL_TABLET | Freq: Every evening | ORAL | Status: DC
  Administered 2016-11-02 – 2016-11-03 (×2): 20 mg via ORAL
  Filled 2016-11-02 (×2): qty 1

## 2016-11-02 MED ORDER — MIDAZOLAM HCL 2 MG/2ML IJ SOLN
INTRAMUSCULAR | Status: AC
  Filled 2016-11-02: qty 2

## 2016-11-02 MED ORDER — BUPIVACAINE IN DEXTROSE 0.75-8.25 % IT SOLN
INTRATHECAL | Status: DC | PRN
  Administered 2016-11-02: 2 mL via INTRATHECAL

## 2016-11-02 MED ORDER — VALSARTAN-HYDROCHLOROTHIAZIDE 80-12.5 MG PO TABS: 1.0000 | ORAL_TABLET | Freq: Every evening | ORAL | Status: DC

## 2016-11-02 MED ORDER — METOCLOPRAMIDE HCL 5 MG PO TABS: 5.0000 mg | ORAL_TABLET | Freq: Three times a day (TID) | ORAL | Status: DC | PRN

## 2016-11-02 MED ORDER — BUPIVACAINE-EPINEPHRINE (PF) 0.5% -1:200000 IJ SOLN
INTRAMUSCULAR | Status: AC
  Filled 2016-11-02: qty 30

## 2016-11-02 MED ORDER — LEVOCETIRIZINE DIHYDROCHLORIDE 5 MG PO TABS: 5.0000 mg | ORAL_TABLET | Freq: Every day | ORAL | Status: DC | PRN

## 2016-11-02 MED ORDER — TRANEXAMIC ACID 1000 MG/10ML IV SOLN
1000.0000 mg | INTRAVENOUS | Status: AC
  Administered 2016-11-02: 1000 mg via INTRAVENOUS
  Filled 2016-11-02: qty 10

## 2016-11-02 MED ORDER — PROPOFOL 10 MG/ML IV BOLUS
INTRAVENOUS | Status: DC | PRN
  Administered 2016-11-02 (×2): 20 mg via INTRAVENOUS

## 2016-11-02 SURGICAL SUPPLY — 44 items
BAG DECANTER FOR FLEXI CONT (MISCELLANEOUS) ×2 IMPLANT
BLADE SAW SGTL 18X1.27X75 (BLADE) ×2 IMPLANT
CAPT HIP TOTAL 2 ×1 IMPLANT
COVER PERINEAL POST (MISCELLANEOUS) ×2 IMPLANT
COVER SURGICAL LIGHT HANDLE (MISCELLANEOUS) ×2 IMPLANT
DECANTER SPIKE VIAL GLASS SM (MISCELLANEOUS) ×1 IMPLANT
DRAPE C-ARM 42X72 X-RAY (DRAPES) ×3 IMPLANT
DRAPE STERI IOBAN 125X83 (DRAPES) ×2 IMPLANT
DRAPE U-SHAPE 47X51 STRL (DRAPES) ×4 IMPLANT
DRSG AQUACEL AG ADV 3.5X10 (GAUZE/BANDAGES/DRESSINGS) ×2 IMPLANT
DURAPREP 26ML APPLICATOR (WOUND CARE) ×2 IMPLANT
ELECT BLADE 4.0 EZ CLEAN MEGAD (MISCELLANEOUS) ×2
ELECT REM PT RETURN 9FT ADLT (ELECTROSURGICAL) ×2
ELECTRODE BLDE 4.0 EZ CLN MEGD (MISCELLANEOUS) ×1 IMPLANT
ELECTRODE REM PT RTRN 9FT ADLT (ELECTROSURGICAL) ×1 IMPLANT
FACESHIELD WRAPAROUND (MASK) ×6 IMPLANT
FACESHIELD WRAPAROUND OR TEAM (MASK) ×2 IMPLANT
GLOVE BIO SURGEON STRL SZ7.5 (GLOVE) ×2 IMPLANT
GLOVE BIO SURGEON STRL SZ8.5 (GLOVE) ×2 IMPLANT
GLOVE BIOGEL PI IND STRL 8 (GLOVE) ×1 IMPLANT
GLOVE BIOGEL PI IND STRL 9 (GLOVE) ×1 IMPLANT
GLOVE BIOGEL PI INDICATOR 8 (GLOVE) ×1
GLOVE BIOGEL PI INDICATOR 9 (GLOVE) ×1
GOWN STRL REUS W/ TWL LRG LVL3 (GOWN DISPOSABLE) ×1 IMPLANT
GOWN STRL REUS W/ TWL XL LVL3 (GOWN DISPOSABLE) ×2 IMPLANT
GOWN STRL REUS W/TWL LRG LVL3 (GOWN DISPOSABLE) ×2
GOWN STRL REUS W/TWL XL LVL3 (GOWN DISPOSABLE) ×6
KIT BASIN OR (CUSTOM PROCEDURE TRAY) ×2 IMPLANT
KIT ROOM TURNOVER OR (KITS) ×2 IMPLANT
MANIFOLD NEPTUNE II (INSTRUMENTS) ×2 IMPLANT
NEEDLE HYPO 22GX1.5 SAFETY (NEEDLE) ×4 IMPLANT
NS IRRIG 1000ML POUR BTL (IV SOLUTION) ×2 IMPLANT
PACK TOTAL JOINT (CUSTOM PROCEDURE TRAY) ×2 IMPLANT
PAD ARMBOARD 7.5X6 YLW CONV (MISCELLANEOUS) ×4 IMPLANT
SUT VIC AB 1 CTX 36 (SUTURE) ×2
SUT VIC AB 1 CTX36XBRD ANBCTR (SUTURE) ×1 IMPLANT
SUT VIC AB 2-0 CT1 27 (SUTURE) ×2
SUT VIC AB 2-0 CT1 TAPERPNT 27 (SUTURE) ×1 IMPLANT
SUT VIC AB 3-0 PS2 18 (SUTURE) ×2
SUT VIC AB 3-0 PS2 18XBRD (SUTURE) ×1 IMPLANT
SYR CONTROL 10ML LL (SYRINGE) ×4 IMPLANT
TOWEL OR 17X24 6PK STRL BLUE (TOWEL DISPOSABLE) ×2 IMPLANT
TOWEL OR 17X26 10 PK STRL BLUE (TOWEL DISPOSABLE) ×2 IMPLANT
TRAY CATH 16FR W/PLASTIC CATH (SET/KITS/TRAYS/PACK) IMPLANT

## 2016-11-02 NOTE — Discharge Instructions (Signed)

## 2016-11-02 NOTE — Evaluation (Signed)
Physical Therapy Evaluation Patient Details Name: George Maldonado MRN: [id number] DOB: 1965-10-12 Today's Date: 11/02/2016   History of Present Illness  Pt admitted for elective L anterior THA. PSH: R post hip 2014.  Clinical Impression  Pt is s/p THA resulting in the deficits listed below (see PT Problem List). Pt limited by pain but anticipate will progress quickly. Pt will benefit from skilled PT to increase their independence and safety with mobility to allow discharge to the venue listed below.      Follow Up Recommendations Outpatient PT;Supervision - Intermittent    Equipment Recommendations  None recommended by PT    Recommendations for Other Services       Precautions / Restrictions Precautions Precautions: None Restrictions Weight Bearing Restrictions: Yes LLE Weight Bearing: Weight bearing as tolerated      Mobility  Bed Mobility Overal bed mobility: Needs Assistance Bed Mobility: Supine to Sit     Supine to sit: Min assist     General bed mobility comments: pt able to manage LEs, minA for trunk elevation, HOB eelvated  Transfers Overall transfer level: Needs assistance Equipment used: Rolling walker (2 wheeled) Transfers: Sit to/from [employer] Sit to Stand: Min assist Stand pivot transfers: Min guard       General transfer comment: increased time, bed raised due to pt 6'8", v/c's to push up from bed, minA to steady during transition of hands to RW, v/c's for sequencing std pvt to chair  Ambulation/Gait             General Gait Details: limited to chair today due to [employer] Mobility    Modified Rankin (Stroke Patients Only)       Balance Overall balance assessment: No apparent balance deficits (not formally assessed)                                           Pertinent Vitals/Pain Pain Assessment: 0-10 Pain Score: 5  Pain Location: L hip Pain Descriptors /  Indicators: Constant Pain Intervention(s): Monitored during session    Home Living Family/patient expects to be discharged to:: Private residence Living Arrangements: Spouse/significant other Available Help at Discharge: Friend(s);Available 24 hours/day Type of Home: House Home Access: Stairs to enter Entrance Stairs-Rails: Can reach both;Right;Left Entrance Stairs-Number of Steps: 4 Home Layout: One level Home Equipment: Walker - 2 wheels;Tub bench      Prior Function Level of Independence: Independent               Hand Dominance   Dominant Hand: Right    Extremity/Trunk Assessment   Upper Extremity Assessment Upper Extremity Assessment: Overall WFL for tasks assessed    Lower Extremity Assessment Lower Extremity Assessment: LLE deficits/detail LLE Deficits / Details: able to initiate quad set    Cervical / Trunk Assessment Cervical / Trunk Assessment: Normal  Communication   Communication: No difficulties  Cognition Arousal/Alertness: Awake/alert Behavior During Therapy: WFL for tasks assessed/performed Overall Cognitive Status: Within Functional Limits for tasks assessed                                        General Comments      Exercises Total Joint Exercises Ankle Circles/Pumps: AROM;Both;10 reps;Supine Quad  Sets: AROM;Right;10 reps;Supine   Assessment/Plan    PT Assessment Patient needs continued PT services  PT Problem List Decreased strength;Decreased range of motion;Decreased activity tolerance;Decreased balance;Decreased mobility       PT Treatment Interventions DME instruction;Gait training;Stair training;Functional mobility training;Therapeutic activities;Therapeutic exercise    PT Goals (Current goals can be found in the Care Plan section)  Acute Rehab PT Goals Patient Stated Goal: pain under control PT Goal Formulation: With patient Time For Goal Achievement: 11/09/16 Potential to Achieve Goals: Good     Frequency 7X/week   Barriers to discharge        Co-evaluation               AM-PAC PT "6 Clicks" Daily Activity  Outcome Measure Difficulty turning over in bed (including adjusting bedclothes, sheets and blankets)?: Total Difficulty moving from lying on back to sitting on the side of the bed? : Total Difficulty sitting down on and standing up from a chair with arms (e.g., wheelchair, bedside commode, etc,.)?: A Little Help needed moving to and from a bed to chair (including a wheelchair)?: A Little Help needed walking in hospital room?: A Little Help needed climbing 3-5 steps with a railing? : A Lot 6 Click Score: 13    End of Session Equipment Utilized During Treatment: Gait belt Activity Tolerance: Patient tolerated treatment well Patient left: in chair;with call bell/phone within reach;with family/visitor present Nurse Communication: Mobility status;Patient requests pain meds PT Visit Diagnosis: Pain;Difficulty in walking, not elsewhere classified (R26.2) Pain - Right/Left: Left Pain - part of body: Hip    Time: 2876-8115 PT Time Calculation (min) (ACUTE ONLY): 18 min   Charges:   PT Evaluation $PT Eval Moderate Complexity: 1 Mod     PT G CodesKittie Plater, PT, DPT Pager #: [phone] Office #: [phone]   [place] 11/02/2016, 4:33 PM

## 2016-11-02 NOTE — Anesthesia Postprocedure Evaluation (Signed)
Anesthesia Post Note  Patient: [place]  Procedure(s) Performed: Procedure(s) (LRB): LEFT TOTAL HIP ARTHROPLASTY ANTERIOR APPROACH (Left)     Patient location during evaluation: PACU Anesthesia Type: Spinal Level of consciousness: oriented and awake and alert Pain management: pain level controlled Vital Signs Assessment: post-procedure vital signs reviewed and stable Respiratory status: spontaneous breathing, respiratory function stable and patient connected to nasal cannula oxygen Cardiovascular status: blood pressure returned to baseline and stable Postop Assessment: no headache, no backache, spinal receding and patient able to bend at knees Anesthetic complications: no    Last Vitals:  Vitals:   11/02/16 1100 11/02/16 1300  BP: 116/78 114/85  Pulse: (!) 50 (!) 57  Resp: 14 16  Temp: 36.6 C 36.5 C             Effie Berkshire

## 2016-11-02 NOTE — Transfer of Care (Signed)
Immediate Anesthesia Transfer of Care Note  Patient: [place]  Procedure(s) Performed: Procedure(s): LEFT TOTAL HIP ARTHROPLASTY ANTERIOR APPROACH (Left)  Patient Location: PACU  Anesthesia Type:Spinal  Level of Consciousness: drowsy and patient cooperative  Airway & Oxygen Therapy: Patient Spontanous Breathing and Patient connected to face mask oxygen  Post-op Assessment: Report given to RN and Post -op Vital signs reviewed and stable  Post vital signs: Reviewed and stable  Last Vitals:  Vitals:   11/02/16 0547 11/02/16 0955  BP: (!) 159/88 115/67  Pulse: (!) 55 (!) 56  Resp: 20 16  Temp: 36.8 C     Last Pain:  Vitals:   11/02/16 0547  TempSrc: Oral      Patients Stated Pain Goal: 6 (22/48/25 0037)  Complications: No apparent anesthesia complications

## 2016-11-02 NOTE — OR Nursing (Signed)
0945: in&out cath=250cc cyu, per protocol, no trauma

## 2016-11-02 NOTE — Interval H&P Note (Signed)
History and Physical Interval Note:  11/02/2016 6:41 AM  [place]  has presented today for surgery, with the diagnosis of LEFT HIP OSTEOARTHRITIS  The various methods of treatment have been discussed with the patient and family. After consideration of risks, benefits and other options for treatment, the patient has consented to  Procedure(s): LEFT TOTAL HIP ARTHROPLASTY ANTERIOR APPROACH (Left) as a surgical intervention .  The patient's history has been reviewed, patient examined, no change in status, stable for surgery.  I have reviewed the patient's chart and labs.  Questions were answered to the patient's satisfaction.     Kerin Salen

## 2016-11-02 NOTE — Op Note (Signed)
OPERATIVE REPORT    DATE OF PROCEDURE:  11/02/2016       PREOPERATIVE DIAGNOSIS:  LEFT HIP OSTEOARTHRITIS                                                          POSTOPERATIVE DIAGNOSIS:  LEFT HIP OSTEOARTHRITIS                                                           PROCEDURE: Anterior L total hip arthroplasty using a 54 mm DePuy Pinnacle  Cup, [employer], 0-degree polyethylene liner, a +5 36 mm ceramic head, a 8 hi Depuy Triloc stem   SURGEON: ZGYFV,CBSWH J    ASSISTANT:   [employer]  (present throughout entire procedure and necessary for timely completion of the procedure)   ANESTHESIA: Spinal BLOOD LOSS: 400 FLUID REPLACEMENT: 1600 crystalloid Antibiotic: 2gm ancef Tranexamic Acid: 1gm iv 2gm topical COMPLICATIONS: none    INDICATIONS FOR PROCEDURE: A 51 y.o. year-old With  LEFT HIP OSTEOARTHRITIS   for 3 years, x-rays show bone-on-bone arthritic changes, and osteophytes. Despite conservative measures with observation, anti-inflammatory medicine, narcotics, use of a cane, has severe unremitting pain and can ambulate only a few blocks before resting. Patient desires elective L total hip arthroplasty to decrease pain and increase function. The risks, benefits, and alternatives were discussed at length including but not limited to the risks of infection, bleeding, nerve injury, stiffness, blood clots, the need for revision surgery, cardiopulmonary complications, among others, and they were willing to proceed. Questions answered     PROCEDURE IN DETAIL: The patient was identified by armband,  received preoperative IV antibiotics in the holding area at [facility], taken to the operating room , appropriate anesthetic monitors  were attached and  anesthesia was induced with the patienton the gurney. The HANA boots were applied to the feet and he was then transferred to the HANA table with a peroneal post and support underneath the non-operative le,  which was locked in 5 lb traction. Theoperative lower extremity was then prepped and draped in the usual sterile fashion from just above the iliac crest to the knee. And a timeout procedure was performed. We then made a 15 cm incision along the interval at the leading edge of the tensor fascia lata of starting at 2 cm lateral to and 2 cm distal to the ASIS. Small bleeders in the skin and subcutaneous tissue identified and cauterized we dissected down to the fascia and made an incision in the fascia allowing [place] to elevate the fascia of the tensor muscle and exploited the interval between the rectus and the tensor fascia lata. A Hohmann retractor was then placed along the superior neck of the femur and a Cobra retractor along the inferior neck of the femur we teed the capsule starting out at the superior anterior aspect of the acetabulum going distally and made the T along the neck both leaflets of the T were tagged with #2 Ethibond suture. Cobra retractors were then placed along the inferior and superior neck allowing [place] to perform a standard neck cut and  removed the femoral head with a power corkscrew. We then placed a right angle Hohmann retractor along the anterior aspect of the acetabulum a spiked Cobra in the cotyloid notch and posteriorly a Muelller retractor. We then sequentially reamed up to a 54 mm basket reamer obtaining good coverage in all quadrants, verified by C-arm imaging. Under C-arm control with and hammered into place a 54 mm Pinnacle cup in 45 of abduction and 15 of anteversion. The cup seated nicely and required no supplemental screws. We then placed a central hole Eliminator and a 0 polyethylene liner. The foot was then externally rotated to 110, the HANA elevator was placed around the flare of the greater trochanter and the limb was extended and abducted delivering the proximal femur up into the wound. A medium Hohmann retractor was placed over the greater trochanter and a Mueller retractor  along the posterior femoral neck completing the exposure. We then performed releases superiorly and and inferiorly of the capsule going back to the pirformis fossa superiorly and to the lesser trochanter inferiorly. We then entered the proximal femur with the box cutting offset chisel followed by, a canal sounder, the chili pepper and broaching up to a 8 broach. This seated nicely and we reamed the calcar. A trial reduction was performed with a 5 mm 36 mm head.The limb lengths were excellent the hip was stable in 90 of external rotation. At this point the trial components removed and we hammered into place a # 8 hi Tri-Lock stem with [employer] coating. This was a hi offset stem and a + +5 36 mm ceramic ball was then hammered into place the hip was reduced and final C-arm images obtained. The wound was thoroughly irrigated with normal saline solution. We repaired the ant capsule and the tensor fascia lot a with running 0 vicryl suture. the subcutaneous tissue was closed with 2-0 and 3-0 Vicryl suture followed by an Aquacil dressing. At this point the patient was awaken and transferred to hospital gurney without difficulty. The subcutaneous tissue with 0 and 2-0 undyed Vicryl suture and the skin with running  3-0 vicryl subcuticular suture. Aquacil dressing was applied. The patient was then unclamped, rolled supine, awaken extubated and taken to recovery room without difficulty in stable condition.   Lavere Stork J 11/02/2016, 9:27 AM

## 2016-11-02 NOTE — Anesthesia Procedure Notes (Addendum)
Spinal  Patient location during procedure: OR Start time: 11/02/2016 7:35 AM End time: 11/02/2016 7:38 AM Staffing Anesthesiologist: Suella Broad D Performed: anesthesiologist  Preanesthetic Checklist Completed: patient identified, site marked, surgical consent, pre-op evaluation, timeout performed, IV checked, risks and benefits discussed and monitors and equipment checked Spinal Block Patient position: sitting Prep: Betadine Patient monitoring: heart rate, continuous pulse ox, blood pressure and cardiac monitor Approach: midline Location: L4-5 Injection technique: single-shot Needle Needle type: Introducer and Pencan  Needle gauge: 24 G Needle length: 9 cm Additional Notes Negative paresthesia. Negative blood return. Positive free-flowing CSF. Expiration date of kit checked and confirmed. Patient tolerated procedure well, without complications.

## 2016-11-03 ENCOUNTER — Encounter (HOSPITAL_COMMUNITY): Payer: Self-pay | Admitting: Orthopedic Surgery

## 2016-11-03 LAB — CBC
HEMATOCRIT: 39.5 % (ref 39.0–52.0)
Hemoglobin: 12.9 g/dL — ABNORMAL LOW (ref 13.0–17.0)
MCH: 27.7 pg (ref 26.0–34.0)
MCHC: 32.7 g/dL (ref 30.0–36.0)
MCV: 84.9 fL (ref 78.0–100.0)
Platelets: 243 10*3/uL (ref 150–400)
RBC: 4.65 MIL/uL (ref 4.22–5.81)
RDW: 13.9 % (ref 11.5–15.5)
WBC: 14.8 10*3/uL — AB (ref 4.0–10.5)

## 2016-11-03 LAB — BASIC METABOLIC PANEL
Anion gap: 7 (ref 5–15)
BUN: 12 mg/dL (ref 6–20)
CALCIUM: 8.8 mg/dL — AB (ref 8.9–10.3)
CO2: 26 mmol/L (ref 22–32)
CREATININE: 1.14 mg/dL (ref 0.61–1.24)
Chloride: 104 mmol/L (ref 101–111)
GFR calc non Af Amer: >60 mL/min (ref >60)
Glucose, Bld: 109 mg/dL — ABNORMAL HIGH (ref 65–99)
Potassium: 4.6 mmol/L (ref 3.5–5.1)
SODIUM: 137 mmol/L (ref 135–145)

## 2016-11-03 NOTE — Progress Notes (Signed)
PATIENT ID: George Maldonado  MRN: [id number]  DOB/AGE:  [birth year] / 51 y.o.  1 Day Post-Op Procedure(s) (LRB): LEFT TOTAL HIP ARTHROPLASTY ANTERIOR APPROACH (Left)    PROGRESS NOTE Subjective: Patient is alert, oriented, no Nausea, no Vomiting, yes passing gas, . Taking PO well. Denies SOB, Chest or Calf Pain. Using Incentive Spirometer, PAS in place. Ambulate WBAT Patient reports pain as  6/10  .    Objective: Vital signs in last 24 hours: Vitals:   11/02/16 1300 11/02/16 1800 11/02/16 2038 11/03/16 0611  BP: 114/85  (!) 156/95 (!) 143/68  Pulse: (!) 57  61 68  Resp: 16  18 17   Temp: 97.7 F (36.5 C)  97.7 F (36.5 C) 99.3 F (37.4 C)  TempSrc: Oral  Oral Oral  SpO2: 100%  96% 98%  Weight:  (!) 136.5 kg (301 lb)    Height:  6\' 8"  (2.032 m)        Intake/Output from previous day: I/O last 3 completed shifts: In: 2152.1 [P.O.:240; I.V.:1912.1] Out: 600 [Urine:300; Blood:300]   Intake/Output this shift: No intake/output data recorded.   LABORATORY DATA:  Recent Labs  11/03/16 0250  WBC 14.8*  HGB 12.9*  HCT 39.5  PLT 243  NA 137  K 4.6  CL 104  CO2 26  BUN 12  CREATININE 1.14  GLUCOSE 109*  CALCIUM 8.8*    Examination: Neurologically intact ABD soft Neurovascular intact Sensation intact distally Intact pulses distally Dorsiflexion/Plantar flexion intact Incision: dressing C/D/I No cellulitis present Compartment soft} XR AP&Lat of hip shows well placed\fixed THA  Assessment:   1 Day Post-Op Procedure(s) (LRB): LEFT TOTAL HIP ARTHROPLASTY ANTERIOR APPROACH (Left) ADDITIONAL DIAGNOSIS:  Expected Acute Blood Loss Anemia,   Plan: PT/OT WBAT, THA  DVT Prophylaxis: SCDx72 hrs, ASA 325 mg BID x 2 weeks  DISCHARGE PLAN: Home,Probably tomorrow  DISCHARGE NEEDS: HHPT, Walker and 3-in-1 comode seat

## 2016-11-03 NOTE — Progress Notes (Signed)
Physical Therapy Treatment Patient Details Name: George Maldonado MRN: [id number] DOB: Aug 14, 1965 Today's Date: 11/03/2016    History of Present Illness Pt admitted for elective L anterior THA. PSH: R post hip 2014.    PT Comments    Pt with increased L hip pain limiting ambulation tolerance and active L hip flexion. Pt unable to consistently clear L foot during ambulation requiring modA from PT to advance L LE. Anticipate once pain under control pt with progress quickly.  Follow Up Recommendations  Outpatient PT;Supervision - Intermittent     Equipment Recommendations  None recommended by PT    Recommendations for Other Services       Precautions / Restrictions Precautions Precautions: None Restrictions Weight Bearing Restrictions: Yes LLE Weight Bearing: Weight bearing as tolerated    Mobility  Bed Mobility Overal bed mobility: Needs Assistance Bed Mobility: Supine to Sit     Supine to sit: Min assist     General bed mobility comments: HOB elevated, used bed rails, minA for L LE management, v/c's for long sit technique  Transfers Overall transfer level: Needs assistance Equipment used: Rolling walker (2 wheeled) Transfers: Sit to/from Stand Sit to Stand: Min guard         General transfer comment: increased time but able to complete with good technique and no physical assist  Ambulation/Gait Ambulation/Gait assistance: Mod assist Ambulation Distance (Feet): 65 Feet Assistive device: Rolling walker (2 wheeled) Gait Pattern/deviations: Decreased stride length;Antalgic;Narrow base of support;Trunk flexed;Step-to pattern;Decreased stance time - left;Decreased step length - right Gait velocity: slow Gait velocity interpretation: Below normal speed for age/gender General Gait Details: pt only able advance L LE through swing phayse <30% of time without mod A. pt with increased pain limited ambulation tolerance and minimizing L LE WBing tolerance.     Stairs            Wheelchair Mobility    Modified Rankin (Stroke Patients Only)       Balance Overall balance assessment: Needs assistance         Standing balance support: No upper extremity supported;During functional activity Standing balance-Leahy Scale: Fair Standing balance comment: pt able to stand and urinate however when pt washed hands at sink pt had to lean on elbows.                            Cognition Arousal/Alertness: Awake/alert Behavior During Therapy: WFL for tasks assessed/performed Overall Cognitive Status: Within Functional Limits for tasks assessed                                        Exercises Total Joint Exercises Ankle Circles/Pumps: AROM;Both;10 reps;Supine Quad Sets: AROM;Right;10 reps;Supine Hip ABduction/ADduction: AAROM;Left;10 reps;Supine Marching in Standing: AAROM;Left;10 reps;Seated    General Comments General comments (skin integrity, edema, etc.): pt assisted into bathroom. pt able to stand and urinate and stand wash hands with supervision      Pertinent Vitals/Pain Pain Assessment: 0-10 Pain Score: 9  Pain Location: L hip  Pain Descriptors / Indicators: Constant Pain Intervention(s): Premedicated before session;Monitored during session    Home Living                      Prior Function            PT Goals (current goals can now be found in the  care plan section) Acute Rehab PT Goals Patient Stated Goal: stop the pain Progress towards PT goals: Progressing toward goals    Frequency    7X/week      PT Plan Current plan remains appropriate    Co-evaluation              AM-PAC PT "6 Clicks" Daily Activity  Outcome Measure  Difficulty turning over in bed (including adjusting bedclothes, sheets and blankets)?: Total Difficulty moving from lying on back to sitting on the side of the bed? : Total Difficulty sitting down on and standing up from a chair with arms  (e.g., wheelchair, bedside commode, etc,.)?: A Little Help needed moving to and from a bed to chair (including a wheelchair)?: A Little Help needed walking in hospital room?: A Little Help needed climbing 3-5 steps with a railing? : Total 6 Click Score: 12    End of Session Equipment Utilized During Treatment: Gait belt Activity Tolerance: Patient tolerated treatment well Patient left:  (sitting EOB to eat lunch) Nurse Communication: Mobility status PT Visit Diagnosis: Pain;Difficulty in walking, not elsewhere classified (R26.2) Pain - Right/Left: Left Pain - part of body: Hip     Time: 8110-3159 PT Time Calculation (min) (ACUTE ONLY): 31 min  Charges:  $Gait Training: 8-22 mins $Therapeutic Exercise: 8-22 mins                    G Codes:       Kittie Plater, PT, DPT Pager #: [phone] Office #: [phone]    [place] 11/03/2016, 8:49 AM

## 2016-11-03 NOTE — Evaluation (Signed)
Physical Therapy Evaluation Patient Details Name: George Maldonado MRN: [id number] DOB: 1965-09-27 Today's Date: 11/03/2016   History of Present Illness  Pt admitted for elective L anterior THA. PSH: R post hip 2014.  Clinical Impression  Pt's pain is under much better control. Pt able to now clear L foot during gait and increased tolerance to 150'. Pt progressing well.    Follow Up Recommendations Outpatient PT;Supervision - Intermittent    Equipment Recommendations  None recommended by PT    Recommendations for Other Services       Precautions / Restrictions Precautions Precautions: None Restrictions Weight Bearing Restrictions: Yes LLE Weight Bearing: Weight bearing as tolerated      Mobility  Bed Mobility Overal bed mobility: Needs Assistance Bed Mobility: Supine to Sit     Supine to sit: Supervision     General bed mobility comments: HOB elevated, used bed rails, but able to manage LEs off EOB  Transfers Overall transfer level: Needs assistance Equipment used: Rolling walker (2 wheeled) Transfers: Sit to/from Stand Sit to Stand: Min guard Stand pivot transfers: Min guard       General transfer comment: increased time but able to complete with good technique and no physical assist  Ambulation/Gait Ambulation/Gait assistance: Min guard Ambulation Distance (Feet): 120 Feet Assistive device: Rolling walker (2 wheeled) Gait Pattern/deviations: Decreased stride length Gait velocity: slow Gait velocity interpretation: Below normal speed for age/gender General Gait Details: pt initially step to but gradually progressed to step through. pt able to clear L LE consistantly 100% of time  Stairs            Wheelchair Mobility    Modified Rankin (Stroke Patients Only)       Balance Overall balance assessment: Needs assistance         Standing balance support: Bilateral upper extremity supported Standing balance-Leahy Scale: Fair Standing  balance comment: required RW for standing ther ex                             Pertinent Vitals/Pain Pain Assessment: 0-10 Pain Score: 4  Pain Location: L hip Pain Descriptors / Indicators: Sore Pain Intervention(s): Monitored during session    Home Living                        Prior Function                 Hand Dominance        Extremity/Trunk Assessment                Communication      Cognition Arousal/Alertness: Awake/alert Behavior During Therapy: WFL for tasks assessed/performed Overall Cognitive Status: Within Functional Limits for tasks assessed                                        General Comments      Exercises Total Joint Exercises Heel Slides: AROM;Left;10 reps;Seated Long Arc Quad: AROM;Left;20 reps;Seated Marching in Standing: AAROM;Left;10 reps;Standing   Assessment/Plan    PT Assessment    PT Problem List         PT Treatment Interventions      PT Goals (Current goals can be found in the Care Plan section)       Frequency 7X/week   Barriers to discharge  Co-evaluation               AM-PAC PT "6 Clicks" Daily Activity  Outcome Measure Difficulty turning over in bed (including adjusting bedclothes, sheets and blankets)?: Total Difficulty moving from lying on back to sitting on the side of the bed? : Total Difficulty sitting down on and standing up from a chair with arms (e.g., wheelchair, bedside commode, etc,.)?: A Little Help needed moving to and from a bed to chair (including a wheelchair)?: A Little Help needed walking in hospital room?: A Little Help needed climbing 3-5 steps with a railing? : A Little 6 Click Score: 14    End of Session   Activity Tolerance: Patient tolerated treatment well   Nurse Communication: Mobility status PT Visit Diagnosis: Pain;Difficulty in walking, not elsewhere classified (R26.2) Pain - Right/Left: Left Pain - part of body:  Hip    Time: 7322-0254 PT Time Calculation (min) (ACUTE ONLY): 27 min   Charges:     PT Treatments $Gait Training: 8-22 mins $Therapeutic Exercise: 8-22 mins   PT G Codes:        Kittie Plater, PT, DPT Pager #: [phone] Office #: [phone]   [place] 11/03/2016, 4:33 PM

## 2016-11-04 LAB — CBC
HEMATOCRIT: 38.1 % — AB (ref 39.0–52.0)
HEMOGLOBIN: 12.8 g/dL — AB (ref 13.0–17.0)
MCH: 28.3 pg (ref 26.0–34.0)
MCHC: 33.6 g/dL (ref 30.0–36.0)
MCV: 84.1 fL (ref 78.0–100.0)
Platelets: 228 10*3/uL (ref 150–400)
RBC: 4.53 MIL/uL (ref 4.22–5.81)
RDW: 14 % (ref 11.5–15.5)
WBC: 16 10*3/uL — ABNORMAL HIGH (ref 4.0–10.5)

## 2016-11-04 NOTE — Progress Notes (Signed)
Provided discharge education/instructions, all questions and concerns addressed, patient not in distress, discharged home accompanied by family member.

## 2016-11-04 NOTE — Discharge Summary (Signed)
Patient ID: George Maldonado MRN: [id number] DOB/AGE: 07/05/1965 51 y.o.  Admit date: 11/02/2016 Discharge date: 11/04/2016  Admission Diagnoses:  Principal Problem:   Osteoarthritis of left hip Active Problems:   Primary osteoarthritis of left hip   Discharge Diagnoses:  Same  Past Medical History:  Diagnosis Date  . Arthritis   . GERD (gastroesophageal reflux disease)    occasionally  . Gout   . Heart murmur    as a child  . Hypertension     Surgeries: Procedure(s): LEFT TOTAL HIP ARTHROPLASTY ANTERIOR APPROACH on 11/02/2016   Consultants:   Discharged Condition: Improved  Hospital Course: [place] is an 51 y.o. male who was admitted 11/02/2016 for operative treatment ofOsteoarthritis of left hip. Patient has severe unremitting pain that affects sleep, daily activities, and work/hobbies. After pre-op clearance the patient was taken to the operating room on 11/02/2016 and underwent  Procedure(s): LEFT TOTAL HIP ARTHROPLASTY ANTERIOR APPROACH.    Patient was given perioperative antibiotics: Anti-infectives    Start     Dose/Rate Route Frequency Ordered Stop   11/02/16 0600  ceFAZolin (ANCEF) 3 g in dextrose 5 % 50 mL IVPB     3 g 130 mL/hr over 30 Minutes Intravenous To ShortStay Surgical 10/30/16 0851 11/02/16 0800       Patient was given sequential compression devices, early ambulation, and chemoprophylaxis to prevent DVT.  Patient benefited maximally from hospital stay and there were no complications.    Recent vital signs: Patient Vitals for the past 24 hrs:  BP Temp Temp src Pulse Resp SpO2  11/04/16 0534 (!) 145/87 99.5 F (37.5 C) Oral 70 18 98 %  11/03/16 2057 139/73 99 F (37.2 C) Oral 70 18 96 %  11/03/16 1502 120/66 97.7 F (36.5 C) Oral 80 - 97 %     Recent laboratory studies:  Recent Labs  11/03/16 0250 11/04/16 0334  WBC 14.8* 16.0*  HGB 12.9* 12.8*  HCT 39.5 38.1*  PLT 243 228  NA 137  --   K 4.6  --   CL 104  --    CO2 26  --   BUN 12  --   CREATININE 1.14  --   GLUCOSE 109*  --   CALCIUM 8.8*  --      Discharge Medications:   Allergies as of 11/04/2016   No Known Allergies     Medication List    STOP taking these medications   ibuprofen 200 MG tablet Commonly known as:  ADVIL,MOTRIN   meloxicam 15 MG tablet Commonly known as:  MOBIC     TAKE these medications   allopurinol 300 MG tablet Commonly known as:  ZYLOPRIM Take 300 mg by mouth every evening. (1900)   aspirin 325 MG EC tablet Take 1 tablet (325 mg total) by mouth 2 (two) times daily. What changed:  when to take this  additional instructions   indomethacin 50 MG capsule Commonly known as:  INDOCIN Take 50 mg by mouth 3 (three) times daily as needed (for gout flares).   levocetirizine 5 MG tablet Commonly known as:  XYZAL Take 5 mg by mouth daily as needed for allergies.   LUBRICANT EYE DROPS 0.4-0.3 % Soln Generic drug:  Polyethyl Glycol-Propyl Glycol Place 1 drop into both eyes 2 (two) times daily as needed (for dry/irritated eyes).   methocarbamol 500 MG tablet Commonly known as:  ROBAXIN Take 1 tablet (500 mg total) by mouth 2 (two) times daily with a meal.  oxyCODONE-acetaminophen 5-325 MG tablet Commonly known as:  ROXICET Take 1 tablet by mouth every 4 (four) hours as needed.   simvastatin 20 MG tablet Commonly known as:  ZOCOR Take 20 mg by mouth every evening. (1900)   valsartan-hydrochlorothiazide 80-12.5 MG tablet Commonly known as:  DIOVAN-HCT Take 1 tablet by mouth every evening. (1900)            Durable Medical Equipment        Start     Ordered   11/02/16 1108  DME Walker rolling  Once    Question:  Patient needs a walker to treat with the following condition  Answer:  Primary osteoarthritis of left hip   11/02/16 1107   11/02/16 1108  DME 3 n 1  Once     11/02/16 1107   11/02/16 1108  DME Bedside commode  Once    Question:  Patient needs a bedside commode to treat with the  following condition  Answer:  Primary osteoarthritis of left hip   11/02/16 1107      Diagnostic Studies: Dg Chest 2 View  Result Date: 10/22/2016 CLINICAL DATA:  Preoperative evaluation for upcoming hip replacement EXAM: CHEST  2 VIEW COMPARISON:  07/25/2012 FINDINGS: The heart size and mediastinal contours are within normal limits. Both lungs are clear. The visualized skeletal structures are unremarkable. IMPRESSION: No active cardiopulmonary disease. Electronically Signed   By: Inez Catalina M.D.   On: 10/22/2016 12:56   Dg C-arm 1-60 Min  Result Date: 11/02/2016 CLINICAL DATA:  Status post anterior approach left total hip arthroplasty EXAM: OPERATIVE left HIP (WITH PELVIS IF PERFORMED) 3 VIEWS TECHNIQUE: Fluoroscopic spot image(s) were submitted for interpretation post-operatively. Reported fluoro time is 29 seconds. COMPARISON:  None in PACs FINDINGS: The patient has undergone left total hip joint prosthesis placement. Radiographic positioning of the prosthetic components is good. The interface with the native bone appears normal. IMPRESSION: No immediate postprocedure complication following anterior approach left hip arthroplasty. Electronically Signed   By: David  Martinique M.D.   On: 11/02/2016 09:33   Dg Hip Operative Unilat W Or W/o Pelvis Left  Result Date: 11/02/2016 CLINICAL DATA:  Status post anterior approach left total hip arthroplasty EXAM: OPERATIVE left HIP (WITH PELVIS IF PERFORMED) 3 VIEWS TECHNIQUE: Fluoroscopic spot image(s) were submitted for interpretation post-operatively. Reported fluoro time is 29 seconds. COMPARISON:  None in PACs FINDINGS: The patient has undergone left total hip joint prosthesis placement. Radiographic positioning of the prosthetic components is good. The interface with the native bone appears normal. IMPRESSION: No immediate postprocedure complication following anterior approach left hip arthroplasty. Electronically Signed   By: David  Martinique M.D.   On:  11/02/2016 09:33    Disposition: 01-Home or Self Care  Discharge Instructions    Call MD / Call 911    Complete by:  As directed    If you experience chest pain or shortness of breath, CALL 911 and be transported to the hospital emergency room.  If you develope a fever above 101 F, pus (white drainage) or increased drainage or redness at the wound, or calf pain, call your surgeon's office.   Constipation Prevention    Complete by:  As directed    Drink plenty of fluids.  Prune juice may be helpful.  You may use a stool softener, such as Colace (over the counter) 100 mg twice a day.  Use MiraLax (over the counter) for constipation as needed.   Diet - low sodium heart healthy  Complete by:  As directed    Driving restrictions    Complete by:  As directed    No driving for 2 weeks   Follow the hip precautions as taught in Physical Therapy    Complete by:  As directed    Increase activity slowly as tolerated    Complete by:  As directed    Patient may shower    Complete by:  As directed    You may shower without a dressing once there is no drainage.  Do not wash over the wound.  If drainage remains, cover wound with plastic wrap and then shower.      Follow-up Information    Frederik Pear, MD Follow up in 2 week(s).   Specialty:  Orthopedic Surgery Contact information: [place] [address] [phone]            Signed: Hardin Negus ERIC R 11/04/2016, 7:27 AM

## 2016-11-04 NOTE — Progress Notes (Signed)
PATIENT ID: George Maldonado  MRN: [id number]  DOB/AGE:  51-Feb-1967 / 51 y.o.  2 Days Post-Op Procedure(s) (LRB): LEFT TOTAL HIP ARTHROPLASTY ANTERIOR APPROACH (Left)    PROGRESS NOTE Subjective: Patient is alert, oriented, no Nausea, no Vomiting, yes passing gas, . Taking PO well. Denies SOB, Chest or Calf Pain. Using Incentive Spirometer, PAS in place. Ambulate WBAT with pt walking 120 ft with PT. Patient reports pain as  Mild this am.    Objective: Vital signs in last 24 hours: Vitals:   11/03/16 0611 11/03/16 1502 11/03/16 2057 11/04/16 0534  BP: (!) 143/68 120/66 139/73 (!) 145/87  Pulse: 68 80 70 70  Resp: 17  18 18   Temp: 99.3 F (37.4 C) 97.7 F (36.5 C) 99 F (37.2 C) 99.5 F (37.5 C)  TempSrc: Oral Oral Oral Oral  SpO2: 98% 97% 96% 98%  Weight:      Height:          Intake/Output from previous day: No intake/output data recorded.   Intake/Output this shift: No intake/output data recorded.   LABORATORY DATA:  Recent Labs  11/03/16 0250 11/04/16 0334  WBC 14.8* 16.0*  HGB 12.9* 12.8*  HCT 39.5 38.1*  PLT 243 228  NA 137  --   K 4.6  --   CL 104  --   CO2 26  --   BUN 12  --   CREATININE 1.14  --   GLUCOSE 109*  --   CALCIUM 8.8*  --     Examination: Neurologically intact Neurovascular intact Sensation intact distally Intact pulses distally Dorsiflexion/Plantar flexion intact Incision: dressing C/D/I No cellulitis present Compartment soft} XR AP&Lat of hip shows well placed\fixed THA  Assessment:   2 Days Post-Op Procedure(s) (LRB): LEFT TOTAL HIP ARTHROPLASTY ANTERIOR APPROACH (Left) ADDITIONAL DIAGNOSIS:  Expected Acute Blood Loss Anemia,   Plan: PT/OT WBAT, THA  DVT Prophylaxis: SCDx72 hrs, ASA 325 mg BID x 2 weeks  DISCHARGE PLAN: Home  DISCHARGE NEEDS: HHPT, Walker and 3-in-1 comode seat

## 2016-11-04 NOTE — Progress Notes (Signed)
Physical Therapy Treatment Patient Details Name: George Maldonado MRN: [id number] DOB: 07/10/1965 Today's Date: 11/04/2016    History of Present Illness Pt admitted for elective L anterior THA. PSH: R post hip 2014.    PT Comments    Pt with improved ambulation tolerance and demo's ability to safely navigate stairs this session to safely return home with spouse once medically steady. Acute PT to con't to follow.   Follow Up Recommendations  Outpatient PT;Supervision - Intermittent     Equipment Recommendations  None recommended by PT    Recommendations for Other Services       Precautions / Restrictions Precautions Precautions: None Restrictions Weight Bearing Restrictions: Yes LLE Weight Bearing: Weight bearing as tolerated    Mobility  Bed Mobility Overal bed mobility: Needs Assistance Bed Mobility: Supine to Sit     Supine to sit: Supervision     General bed mobility comments: HOB elevated as pt states he'll have pillows elevating trunk at home. no use of bedrails to mimic home set up  Transfers Overall transfer level: Needs assistance Equipment used: Rolling walker (2 wheeled) Transfers: Sit to/from Stand Sit to Stand: Supervision         General transfer comment: increased time but able to complete safely  Ambulation/Gait Ambulation/Gait assistance: Supervision Ambulation Distance (Feet): 160 Feet Assistive device: Rolling walker (2 wheeled) Gait Pattern/deviations: Step-through pattern;Decreased stride length Gait velocity: slow Gait velocity interpretation: Below normal speed for age/gender General Gait Details: pt progressively increased stride length as L hip loosened up, v/c's to increase WBing on bilat LEs and decrease bilat UE WBing   Stairs Stairs: Yes   Stair Management: Two rails;Step to pattern Number of Stairs: 2 (x2) General stair comments: pt able to ascend without difficulty, minA for L LE management to lower L LE during  descent  Wheelchair Mobility    Modified Rankin (Stroke Patients Only)       Balance           Standing balance support: During functional activity;No upper extremity supported Standing balance-Leahy Scale: Fair                              Cognition Arousal/Alertness: Awake/alert Behavior During Therapy: WFL for tasks assessed/performed Overall Cognitive Status: Within Functional Limits for tasks assessed                                        Exercises Total Joint Exercises Ankle Circles/Pumps: AROM;Both;10 reps;Supine Heel Slides: AROM;Left;10 reps;Seated Long Arc Quad: AROM;Left;20 reps;Seated Marching in Standing: AAROM;Left;10 reps;Standing    General Comments        Pertinent Vitals/Pain Pain Assessment: 0-10 Pain Score: 5  Pain Location: L hip Pain Descriptors / Indicators: Sore Pain Intervention(s): Monitored during session    Home Living                      Prior Function            PT Goals (current goals can now be found in the care plan section) Acute Rehab PT Goals Patient Stated Goal: home asap Progress towards PT goals: Progressing toward goals    Frequency    7X/week      PT Plan Current plan remains appropriate    Co-evaluation  AM-PAC PT "6 Clicks" Daily Activity  Outcome Measure  Difficulty turning over in bed (including adjusting bedclothes, sheets and blankets)?: A Little Difficulty moving from lying on back to sitting on the side of the bed? : A Little Difficulty sitting down on and standing up from a chair with arms (e.g., wheelchair, bedside commode, etc,.)?: A Little Help needed moving to and from a bed to chair (including a wheelchair)?: A Little Help needed walking in hospital room?: A Little Help needed climbing 3-5 steps with a railing? : A Little 6 Click Score: 18    End of Session Equipment Utilized During Treatment: Gait belt Activity Tolerance:  Patient tolerated treatment well   Nurse Communication: Mobility status PT Visit Diagnosis: Pain;Difficulty in walking, not elsewhere classified (R26.2) Pain - Right/Left: Left Pain - part of body: Hip     Time: 0727-0756 PT Time Calculation (min) (ACUTE ONLY): 29 min  Charges:  $Gait Training: 8-22 mins $Therapeutic Exercise: 8-22 mins                    G Codes:       Kittie Plater, PT, DPT Pager #: [phone] Office #: [phone]    [place] 11/04/2016, 8:27 AM

## 2016-11-10 DIAGNOSIS — Z96642 Presence of left artificial hip joint: Secondary | ICD-10-CM | POA: Diagnosis not present

## 2016-11-10 DIAGNOSIS — R262 Difficulty in walking, not elsewhere classified: Secondary | ICD-10-CM | POA: Diagnosis not present

## 2016-11-10 DIAGNOSIS — M25552 Pain in left hip: Secondary | ICD-10-CM | POA: Diagnosis not present

## 2016-11-11 DIAGNOSIS — M25552 Pain in left hip: Secondary | ICD-10-CM | POA: Diagnosis not present

## 2016-11-11 DIAGNOSIS — Z96642 Presence of left artificial hip joint: Secondary | ICD-10-CM | POA: Diagnosis not present

## 2016-11-11 DIAGNOSIS — R262 Difficulty in walking, not elsewhere classified: Secondary | ICD-10-CM | POA: Diagnosis not present

## 2016-11-16 DIAGNOSIS — M25552 Pain in left hip: Secondary | ICD-10-CM | POA: Diagnosis not present

## 2016-11-16 DIAGNOSIS — Z96642 Presence of left artificial hip joint: Secondary | ICD-10-CM | POA: Diagnosis not present

## 2016-11-16 DIAGNOSIS — R262 Difficulty in walking, not elsewhere classified: Secondary | ICD-10-CM | POA: Diagnosis not present

## 2016-11-17 DIAGNOSIS — M25552 Pain in left hip: Secondary | ICD-10-CM | POA: Diagnosis not present

## 2016-11-19 DIAGNOSIS — R262 Difficulty in walking, not elsewhere classified: Secondary | ICD-10-CM | POA: Diagnosis not present

## 2016-11-19 DIAGNOSIS — M25552 Pain in left hip: Secondary | ICD-10-CM | POA: Diagnosis not present

## 2016-11-19 DIAGNOSIS — Z96642 Presence of left artificial hip joint: Secondary | ICD-10-CM | POA: Diagnosis not present

## 2016-11-23 DIAGNOSIS — M25552 Pain in left hip: Secondary | ICD-10-CM | POA: Diagnosis not present

## 2016-11-23 DIAGNOSIS — Z96642 Presence of left artificial hip joint: Secondary | ICD-10-CM | POA: Diagnosis not present

## 2016-11-23 DIAGNOSIS — R262 Difficulty in walking, not elsewhere classified: Secondary | ICD-10-CM | POA: Diagnosis not present

## 2016-11-26 DIAGNOSIS — Z96642 Presence of left artificial hip joint: Secondary | ICD-10-CM | POA: Diagnosis not present

## 2016-11-26 DIAGNOSIS — R262 Difficulty in walking, not elsewhere classified: Secondary | ICD-10-CM | POA: Diagnosis not present

## 2016-11-26 DIAGNOSIS — M25552 Pain in left hip: Secondary | ICD-10-CM | POA: Diagnosis not present

## 2016-11-30 DIAGNOSIS — M25552 Pain in left hip: Secondary | ICD-10-CM | POA: Diagnosis not present

## 2016-11-30 DIAGNOSIS — Z96642 Presence of left artificial hip joint: Secondary | ICD-10-CM | POA: Diagnosis not present

## 2016-11-30 DIAGNOSIS — R262 Difficulty in walking, not elsewhere classified: Secondary | ICD-10-CM | POA: Diagnosis not present

## 2016-12-03 DIAGNOSIS — Z96642 Presence of left artificial hip joint: Secondary | ICD-10-CM | POA: Diagnosis not present

## 2016-12-03 DIAGNOSIS — M25552 Pain in left hip: Secondary | ICD-10-CM | POA: Diagnosis not present

## 2016-12-03 DIAGNOSIS — R262 Difficulty in walking, not elsewhere classified: Secondary | ICD-10-CM | POA: Diagnosis not present

## 2016-12-15 DIAGNOSIS — Z09 Encounter for follow-up examination after completed treatment for conditions other than malignant neoplasm: Secondary | ICD-10-CM | POA: Diagnosis not present

## 2016-12-15 DIAGNOSIS — Z96652 Presence of left artificial knee joint: Secondary | ICD-10-CM | POA: Diagnosis not present

## 2016-12-15 DIAGNOSIS — R262 Difficulty in walking, not elsewhere classified: Secondary | ICD-10-CM | POA: Diagnosis not present

## 2016-12-15 DIAGNOSIS — M25552 Pain in left hip: Secondary | ICD-10-CM | POA: Diagnosis not present

## 2016-12-15 DIAGNOSIS — Z96642 Presence of left artificial hip joint: Secondary | ICD-10-CM | POA: Diagnosis not present

## 2016-12-17 DIAGNOSIS — R262 Difficulty in walking, not elsewhere classified: Secondary | ICD-10-CM | POA: Diagnosis not present

## 2016-12-17 DIAGNOSIS — M25552 Pain in left hip: Secondary | ICD-10-CM | POA: Diagnosis not present

## 2016-12-17 DIAGNOSIS — Z96642 Presence of left artificial hip joint: Secondary | ICD-10-CM | POA: Diagnosis not present

## 2016-12-21 DIAGNOSIS — Z96642 Presence of left artificial hip joint: Secondary | ICD-10-CM | POA: Diagnosis not present

## 2016-12-21 DIAGNOSIS — M25552 Pain in left hip: Secondary | ICD-10-CM | POA: Diagnosis not present

## 2016-12-21 DIAGNOSIS — R262 Difficulty in walking, not elsewhere classified: Secondary | ICD-10-CM | POA: Diagnosis not present

## 2016-12-23 DIAGNOSIS — M25552 Pain in left hip: Secondary | ICD-10-CM | POA: Diagnosis not present

## 2016-12-23 DIAGNOSIS — Z96642 Presence of left artificial hip joint: Secondary | ICD-10-CM | POA: Diagnosis not present

## 2016-12-23 DIAGNOSIS — R262 Difficulty in walking, not elsewhere classified: Secondary | ICD-10-CM | POA: Diagnosis not present

## 2016-12-30 DIAGNOSIS — R262 Difficulty in walking, not elsewhere classified: Secondary | ICD-10-CM | POA: Diagnosis not present

## 2016-12-30 DIAGNOSIS — M25552 Pain in left hip: Secondary | ICD-10-CM | POA: Diagnosis not present

## 2016-12-30 DIAGNOSIS — Z96642 Presence of left artificial hip joint: Secondary | ICD-10-CM | POA: Diagnosis not present

## 2017-01-01 ENCOUNTER — Encounter (HOSPITAL_COMMUNITY): Payer: Self-pay

## 2017-01-01 ENCOUNTER — Emergency Department (HOSPITAL_COMMUNITY)
Admission: EM | Admit: 2017-01-01 | Discharge: 2017-01-01 | Disposition: A | Discharge status: Home / Self Care | Payer: 59 | Attending: Emergency Medicine | Admitting: Emergency Medicine

## 2017-01-01 ENCOUNTER — Emergency Department (HOSPITAL_COMMUNITY): Payer: 59

## 2017-01-01 DIAGNOSIS — N132 Hydronephrosis with renal and ureteral calculous obstruction: Secondary | ICD-10-CM | POA: Diagnosis not present

## 2017-01-01 DIAGNOSIS — N1330 Unspecified hydronephrosis: Secondary | ICD-10-CM | POA: Diagnosis not present

## 2017-01-01 DIAGNOSIS — Z7982 Long term (current) use of aspirin: Secondary | ICD-10-CM | POA: Insufficient documentation

## 2017-01-01 DIAGNOSIS — I10 Essential (primary) hypertension: Secondary | ICD-10-CM | POA: Insufficient documentation

## 2017-01-01 DIAGNOSIS — N201 Calculus of ureter: Secondary | ICD-10-CM | POA: Diagnosis not present

## 2017-01-01 DIAGNOSIS — R011 Cardiac murmur, unspecified: Secondary | ICD-10-CM | POA: Diagnosis not present

## 2017-01-01 DIAGNOSIS — Z79899 Other long term (current) drug therapy: Secondary | ICD-10-CM | POA: Insufficient documentation

## 2017-01-01 DIAGNOSIS — R1032 Left lower quadrant pain: Secondary | ICD-10-CM | POA: Diagnosis present

## 2017-01-01 DIAGNOSIS — R109 Unspecified abdominal pain: Secondary | ICD-10-CM | POA: Diagnosis not present

## 2017-01-01 LAB — CBC
HEMATOCRIT: 41.3 % (ref 39.0–52.0)
HEMOGLOBIN: 13.7 g/dL (ref 13.0–17.0)
MCH: 28 pg (ref 26.0–34.0)
MCHC: 33.2 g/dL (ref 30.0–36.0)
MCV: 84.5 fL (ref 78.0–100.0)
Platelets: 236 10*3/uL (ref 150–400)
RBC: 4.89 MIL/uL (ref 4.22–5.81)
RDW: 13.7 % (ref 11.5–15.5)
WBC: 10.6 10*3/uL — ABNORMAL HIGH (ref 4.0–10.5)

## 2017-01-01 LAB — URINALYSIS, ROUTINE W REFLEX MICROSCOPIC
BACTERIA UA: NONE SEEN
BILIRUBIN URINE: NEGATIVE
Glucose, UA: NEGATIVE mg/dL
KETONES UR: NEGATIVE mg/dL
Leukocytes, UA: NEGATIVE
Nitrite: NEGATIVE
PROTEIN: NEGATIVE mg/dL
Specific Gravity, Urine: 1.018 (ref 1.005–1.030)
Squamous Epithelial / LPF: NONE SEEN
pH: 5 (ref 5.0–8.0)

## 2017-01-01 LAB — COMPREHENSIVE METABOLIC PANEL
ALBUMIN: 4.1 g/dL (ref 3.5–5.0)
ALK PHOS: 80 U/L (ref 38–126)
ALT: 30 U/L (ref 17–63)
ANION GAP: 9 (ref 5–15)
AST: 23 U/L (ref 15–41)
BILIRUBIN TOTAL: 0.8 mg/dL (ref 0.3–1.2)
BUN: 13 mg/dL (ref 6–20)
CALCIUM: 9.4 mg/dL (ref 8.9–10.3)
CO2: 26 mmol/L (ref 22–32)
Chloride: 102 mmol/L (ref 101–111)
Creatinine, Ser: 1.63 mg/dL — ABNORMAL HIGH (ref 0.61–1.24)
GFR calc non Af Amer: 48 mL/min — ABNORMAL LOW (ref >60)
GFR, EST AFRICAN AMERICAN: 55 mL/min — AB (ref >60)
GLUCOSE: 126 mg/dL — AB (ref 65–99)
POTASSIUM: 3.3 mmol/L — AB (ref 3.5–5.1)
SODIUM: 137 mmol/L (ref 135–145)
TOTAL PROTEIN: 7.3 g/dL (ref 6.5–8.1)

## 2017-01-01 LAB — LIPASE, BLOOD: Lipase: 50 U/L (ref 11–51)

## 2017-01-01 MED ORDER — HYDROMORPHONE HCL 1 MG/ML IJ SOLN
1.0000 mg | Freq: Once | INTRAMUSCULAR | Status: AC
  Administered 2017-01-01: 1 mg via INTRAVENOUS
  Filled 2017-01-01: qty 1

## 2017-01-01 MED ORDER — IBUPROFEN 600 MG PO TABS: 600.0000 mg | ORAL_TABLET | Freq: Four times a day (QID) | ORAL | 0 refills | Status: DC | PRN

## 2017-01-01 MED ORDER — ONDANSETRON HCL 4 MG/2ML IJ SOLN
4.0000 mg | Freq: Once | INTRAMUSCULAR | Status: AC
  Administered 2017-01-01: 4 mg via INTRAVENOUS
  Filled 2017-01-01: qty 2

## 2017-01-01 MED ORDER — ONDANSETRON HCL 4 MG PO TABS: 4.0000 mg | ORAL_TABLET | Freq: Four times a day (QID) | ORAL | 0 refills | Status: DC | PRN

## 2017-01-01 MED ORDER — SODIUM CHLORIDE 0.9 % IV BOLUS (SEPSIS)
1000.0000 mL | Freq: Once | INTRAVENOUS | Status: AC
  Administered 2017-01-01: 1000 mL via INTRAVENOUS

## 2017-01-01 MED ORDER — KETOROLAC TROMETHAMINE 30 MG/ML IJ SOLN
30.0000 mg | Freq: Once | INTRAMUSCULAR | Status: AC
  Administered 2017-01-01: 30 mg via INTRAVENOUS
  Filled 2017-01-01: qty 1

## 2017-01-01 MED ORDER — OXYCODONE-ACETAMINOPHEN 5-325 MG PO TABS: 1.0000 | ORAL_TABLET | ORAL | 0 refills | Status: DC | PRN

## 2017-01-01 NOTE — ED Notes (Signed)
Pt returned from CT °

## 2017-01-01 NOTE — ED Notes (Signed)
Pt taken to CT.

## 2017-01-01 NOTE — Discharge Instructions (Signed)
You have a kidney stone on the left side. This can take time to pass. Call urology for follow-up. In the interim, take pain and nausea medications as prescribed.  Return for worsening symptoms, including fever, escalating pain, intractable vomiting or any other symptoms concerning to you.

## 2017-01-01 NOTE — ED Provider Notes (Signed)
[facility] DEPT Provider Note   CSN: [id number] Arrival date & time: 01/01/17  5284     History   Chief Complaint Chief Complaint  Patient presents with  . Abdominal Pain    HPI [place] is a 51 y.o. male.  The history is provided by the patient.  Abdominal Pain   This is a new problem. The current episode started 3 to 5 hours ago. The problem occurs constantly. The problem has not changed since onset.The pain is associated with an unknown factor. The pain is located in the LLQ. The quality of the pain is sharp. The pain is severe. Associated symptoms include nausea and vomiting. Pertinent negatives include fever, diarrhea, dysuria, frequency and hematuria. Nothing aggravates the symptoms. Nothing relieves the symptoms.   51 year old male who presents with sudden onset of left lower quadrant abdominal pain that woke him up at 3 AM from sleep. He has not had similar pain in the past. Associated with nausea and vomiting. No fevers, chills, diarrhea, constipation., Dysuria, urinary frequency, hematuria, chest pain or difficulty breathing. No prior abdominal surgeries. Past Medical History:  Diagnosis Date  . Arthritis   . GERD (gastroesophageal reflux disease)    occasionally  . Gout   . Heart murmur    as a child  . Hypertension     Patient Active Problem List   Diagnosis Date Noted  . Primary osteoarthritis of left hip 11/02/2016  . Osteoarthritis of left hip 10/30/2016  . Osteoarthritis of right hip 08/02/2012    Past Surgical History:  Procedure Laterality Date  . NO PAST SURGERIES    . TOTAL HIP ARTHROPLASTY Right 08/01/2012   Procedure: TOTAL HIP ARTHROPLASTY;  Surgeon: Kerin Salen, MD;  Location: [facility];  Service: Orthopedics;  Laterality: Right;  DEPUY, NEEDS LONG TABLE PATIENT 6'8" 290 LBS  . TOTAL HIP ARTHROPLASTY Left 11/02/2016   Procedure: LEFT TOTAL HIP ARTHROPLASTY ANTERIOR APPROACH;  Surgeon: Frederik Pear, MD;  Location: [facility];  Service:  Orthopedics;  Laterality: Left;       Home Medications    Prior to Admission medications   Medication Sig Start Date End Date Taking? Authorizing Provider  allopurinol (ZYLOPRIM) 300 MG tablet Take 300 mg by mouth every evening. (1900)   Yes [provider]  aspirin EC 81 MG tablet Take 81 mg by mouth daily.   Yes [provider]  ibuprofen (ADVIL,MOTRIN) 200 MG tablet Take 600 mg by mouth every 6 (six) hours as needed for headache or mild pain.   Yes [provider]  indomethacin (INDOCIN) 50 MG capsule Take 50 mg by mouth 3 (three) times daily as needed (for gout flares).   Yes [provider]  levocetirizine (XYZAL) 5 MG tablet Take 5 mg by mouth daily as needed for allergies.   Yes [provider]  meloxicam (MOBIC) 15 MG tablet Take 15 mg by mouth daily as needed for pain.  12/28/16  Yes [provider]  methocarbamol (ROBAXIN) 500 MG tablet Take 1 tablet (500 mg total) by mouth 2 (two) times daily with a meal. 11/02/16  Yes Leighton Parody, PA-C  oxyCODONE-acetaminophen (ROXICET) 5-325 MG tablet Take 1 tablet by mouth every 4 (four) hours as needed. Patient taking differently: Take 1 tablet by mouth at bedtime as needed for moderate pain.  11/02/16  Yes Joanell Rising K, PA-C  Polyethyl Glycol-Propyl Glycol (LUBRICANT EYE DROPS) 0.4-0.3 % SOLN Place 1 drop into both eyes 2 (two) times daily as needed (  for dry/irritated eyes).   Yes [provider]  simvastatin (ZOCOR) 20 MG tablet Take 20 mg by mouth every evening. (1900)   Yes [provider]  tiZANidine (ZANAFLEX) 2 MG tablet Take 2 mg by mouth daily as needed for muscle spasms.  11/05/16  Yes [provider]  traMADol (ULTRAM) 50 MG tablet Take 50 mg by mouth daily as needed for moderate pain.  11/23/16  Yes [provider]  valsartan-hydrochlorothiazide (DIOVAN-HCT) 80-12.5 MG per tablet Take 1 tablet by mouth every evening. (1900)   Yes [provider]  aspirin 325 MG EC tablet Take 1 tablet (325 mg total) by mouth 2 (two) times daily. 11/02/16   Leighton Parody, PA-C  ibuprofen (ADVIL,MOTRIN) 600 MG tablet Take 1 tablet (600 mg total) by mouth every 6 (six) hours as needed. 01/01/17   Forde Dandy, MD  ondansetron (ZOFRAN) 4 MG tablet Take 1 tablet (4 mg total) by mouth every 6 (six) hours as needed for nausea or vomiting. 01/01/17   Forde Dandy, MD  oxyCODONE-acetaminophen (PERCOCET/ROXICET) 5-325 MG tablet Take 1-2 tablets by mouth every 4 (four) hours as needed for moderate pain or severe pain. 01/01/17   Forde Dandy, MD    Family History History reviewed. No pertinent family history.  Social History Social History  Substance Use Topics  . Smoking status: Never Smoker  . Smokeless tobacco: Never Used  . Alcohol use No     Allergies   Patient has no known allergies.   Review of Systems Review of Systems  Constitutional: Negative for fever.  Gastrointestinal: Positive for abdominal pain, nausea and vomiting. Negative for diarrhea.  Genitourinary: Negative for dysuria, frequency and hematuria.  All other systems reviewed and are negative.    Physical Exam Updated Vital Signs BP 140/86 (BP Location: Left Arm)   Pulse 84   Temp 98 F (36.7 C) (Oral)   Resp 16   SpO2 95%   Physical Exam Physical Exam  Nursing note and vitals reviewed. Constitutional:  non-toxic, appears very uncomfortable due to pain Head: Normocephalic and atraumatic.  Mouth/Throat: Oropharynx is clear and moist.  Neck: Normal range of motion. Neck supple.  Cardiovascular: Normal rate and regular rhythm.   Pulmonary/Chest: Effort normal and breath sounds normal.  Abdominal: Soft. There is LLQ tenderness. There is no rebound and no guarding. Minimal left CVA tenderness Musculoskeletal: Normal range of motion.  Neurological: Alert, no facial droop, fluent speech, moves all extremities symmetrically Skin: Skin is warm and dry.    Psychiatric: Cooperative   ED Treatments / Results  Labs (all labs ordered are listed, but only abnormal results are displayed) Labs Reviewed  COMPREHENSIVE METABOLIC PANEL - Abnormal; Notable for the following:       Result Value   Potassium 3.3 (*)    Glucose, Bld 126 (*)    Creatinine, Ser 1.63 (*)    GFR calc non Af Amer 48 (*)    GFR calc Af Amer 55 (*)    All other components within normal limits  CBC - Abnormal; Notable for the following:    WBC 10.6 (*)    All other components within normal limits  URINALYSIS, ROUTINE W REFLEX MICROSCOPIC - Abnormal; Notable for the following:    Hgb urine dipstick MODERATE (*)    All other components within normal limits  LIPASE, BLOOD    EKG  EKG Interpretation None       Radiology Ct Renal Stone Study  Result  Date: 01/01/2017 CLINICAL DATA:  Left side abdominal pain, left flank pain EXAM: CT ABDOMEN AND PELVIS WITHOUT CONTRAST TECHNIQUE: Multidetector CT imaging of the abdomen and pelvis was performed following the standard protocol without IV contrast. COMPARISON:  None. FINDINGS: Lower chest: Lung bases are clear. No effusions. Heart is normal size. Hepatobiliary: No focal hepatic abnormality. Gallbladder unremarkable. Pancreas: No focal abnormality or ductal dilatation. Spleen: No focal abnormality.  Normal size. Adrenals/Urinary Tract: There is moderate left hydronephrosis and perinephric stranding. Left ureter is dilated into the pelvis but is obscured by beam hardening artifact from bilateral hip replacements. I suspect there is a distal left ureteral stone although this cannot be visualized with the artifact. No hydronephrosis or stones on the right. Adrenal glands are unremarkable. Low-density lesions within the upper and midpole of the left kidney likely reflects cysts. Stomach/Bowel: Appendix is normal. Stomach, large and small bowel grossly unremarkable. Vascular/Lymphatic: No evidence of aneurysm or adenopathy. Reproductive:  No visible focal abnormality. Other: No free fluid or free air. Musculoskeletal: No acute bony abnormality. Degenerative changes in the lumbar spine. Bilateral hip replacements noted. IMPRESSION: Left hydronephrosis and perinephric stranding. The left ureter is dilated into the lower pelvis but thin is obscured from beam hardening artifact from bilateral hip replacements. Cannot visualize the distal ureter, but suspect distal left ureteral stone. Electronically Signed   By: Rolm Baptise M.D.   On: 01/01/2017 09:09    Procedures Procedures (including critical care time)  Medications Ordered in ED Medications  HYDROmorphone (DILAUDID) injection 1 mg (1 mg Intravenous Given 01/01/17 0728)  ondansetron (ZOFRAN) injection 4 mg (4 mg Intravenous Given 01/01/17 0727)  sodium chloride 0.9 % bolus 1,000 mL (1,000 mLs Intravenous New Bag/Given 01/01/17 0732)  ketorolac (TORADOL) 30 MG/ML injection 30 mg (30 mg Intravenous Given 01/01/17 0844)     Initial Impression / Assessment and Plan / ED Course  I have reviewed the triage vital signs and the nursing notes.  Pertinent labs & imaging results that were available during my care of the patient were reviewed by me and considered in my medical decision making (see chart for details).     Patient very uncomfortable on arrival, unable to lie down. Mildly hypertensive, likely due to pain. Afebrile and otherwise hemodynamically stable. Abdomen soft and nonsurgical, but with some left-sided primarily left lower quadrant abdominal pain. No significant CVA tenderness noted, the presentation suggestive of possible urolithiasis.  Diverticulitis also on the differential.  Given pain control, antiemetics and IV fluids. Blood work, urinalysis, and CT abdomen and pelvis are pending. CT visualized. There is evidence of left-sided hydronephrosis, and potential left ureteral stone. However hardware from the hip obscures some of the direct visualization of the left ureter.  With Dilaudid and Toradol, his pain is controlled. Ua without infection. Minimal creatine elevation 1.6. The patient this time comfortable for outpatient treatment. He will continue supportive care. Urology follow-up prvided as needed. Strict return and follow-up instructions reviewed. She expressed understanding of all discharge instructions and felt comfortable with the plan of care.   Final Clinical Impressions(s) / ED Diagnoses   Final diagnoses:  Ureteral stone with hydronephrosis    New Prescriptions New Prescriptions   IBUPROFEN (ADVIL,MOTRIN) 600 MG TABLET    Take 1 tablet (600 mg total) by mouth every 6 (six) hours as needed.   ONDANSETRON (ZOFRAN) 4 MG TABLET    Take 1 tablet (4 mg total) by mouth every 6 (six) hours as needed for nausea or vomiting.   OXYCODONE-ACETAMINOPHEN (  PERCOCET/ROXICET) 5-325 MG TABLET    Take 1-2 tablets by mouth every 4 (four) hours as needed for moderate pain or severe pain.     Forde Dandy, MD 01/01/17 [phone]

## 2017-01-01 NOTE — ED Triage Notes (Signed)
Pt here for left lower abd pain onset 3 hours ago, sts that no pain in back or groin, but pain was sudden onset, pt with emesis and restless on bed, no hx of kidney stones, reports feels like he needs to have a bm but cant, took oxycodone at midnight for hip

## 2017-01-05 DIAGNOSIS — M25552 Pain in left hip: Secondary | ICD-10-CM | POA: Diagnosis not present

## 2017-01-05 DIAGNOSIS — R262 Difficulty in walking, not elsewhere classified: Secondary | ICD-10-CM | POA: Diagnosis not present

## 2017-01-05 DIAGNOSIS — Z96642 Presence of left artificial hip joint: Secondary | ICD-10-CM | POA: Diagnosis not present

## 2017-01-07 DIAGNOSIS — M25552 Pain in left hip: Secondary | ICD-10-CM | POA: Diagnosis not present

## 2017-01-07 DIAGNOSIS — R262 Difficulty in walking, not elsewhere classified: Secondary | ICD-10-CM | POA: Diagnosis not present

## 2017-01-07 DIAGNOSIS — Z96642 Presence of left artificial hip joint: Secondary | ICD-10-CM | POA: Diagnosis not present

## 2017-01-11 DIAGNOSIS — M25552 Pain in left hip: Secondary | ICD-10-CM | POA: Diagnosis not present

## 2017-01-11 DIAGNOSIS — R262 Difficulty in walking, not elsewhere classified: Secondary | ICD-10-CM | POA: Diagnosis not present

## 2017-01-11 DIAGNOSIS — Z96642 Presence of left artificial hip joint: Secondary | ICD-10-CM | POA: Diagnosis not present

## 2017-01-13 DIAGNOSIS — R262 Difficulty in walking, not elsewhere classified: Secondary | ICD-10-CM | POA: Diagnosis not present

## 2017-01-13 DIAGNOSIS — M25552 Pain in left hip: Secondary | ICD-10-CM | POA: Diagnosis not present

## 2017-01-13 DIAGNOSIS — Z96642 Presence of left artificial hip joint: Secondary | ICD-10-CM | POA: Diagnosis not present

## 2017-01-18 DIAGNOSIS — M25552 Pain in left hip: Secondary | ICD-10-CM | POA: Diagnosis not present

## 2017-01-18 DIAGNOSIS — Z96642 Presence of left artificial hip joint: Secondary | ICD-10-CM | POA: Diagnosis not present

## 2017-01-18 DIAGNOSIS — R252 Cramp and spasm: Secondary | ICD-10-CM | POA: Diagnosis not present

## 2017-01-20 DIAGNOSIS — Z96642 Presence of left artificial hip joint: Secondary | ICD-10-CM | POA: Diagnosis not present

## 2017-01-20 DIAGNOSIS — R262 Difficulty in walking, not elsewhere classified: Secondary | ICD-10-CM | POA: Diagnosis not present

## 2017-01-20 DIAGNOSIS — M25552 Pain in left hip: Secondary | ICD-10-CM | POA: Diagnosis not present

## 2017-01-26 DIAGNOSIS — R262 Difficulty in walking, not elsewhere classified: Secondary | ICD-10-CM | POA: Diagnosis not present

## 2017-01-26 DIAGNOSIS — M25552 Pain in left hip: Secondary | ICD-10-CM | POA: Diagnosis not present

## 2017-01-26 DIAGNOSIS — Z96642 Presence of left artificial hip joint: Secondary | ICD-10-CM | POA: Diagnosis not present

## 2017-01-28 DIAGNOSIS — Z96642 Presence of left artificial hip joint: Secondary | ICD-10-CM | POA: Diagnosis not present

## 2017-01-28 DIAGNOSIS — M25552 Pain in left hip: Secondary | ICD-10-CM | POA: Diagnosis not present

## 2017-01-28 DIAGNOSIS — Z471 Aftercare following joint replacement surgery: Secondary | ICD-10-CM | POA: Diagnosis not present

## 2017-02-23 DIAGNOSIS — M7062 Trochanteric bursitis, left hip: Secondary | ICD-10-CM | POA: Diagnosis not present

## 2017-03-23 DIAGNOSIS — M7062 Trochanteric bursitis, left hip: Secondary | ICD-10-CM | POA: Diagnosis not present

## 2017-03-23 DIAGNOSIS — Z96642 Presence of left artificial hip joint: Secondary | ICD-10-CM | POA: Diagnosis not present

## 2017-04-14 DIAGNOSIS — I10 Essential (primary) hypertension: Secondary | ICD-10-CM | POA: Diagnosis not present

## 2017-04-14 DIAGNOSIS — E6609 Other obesity due to excess calories: Secondary | ICD-10-CM | POA: Diagnosis not present

## 2017-04-14 DIAGNOSIS — H9202 Otalgia, left ear: Secondary | ICD-10-CM | POA: Diagnosis not present

## 2017-04-14 DIAGNOSIS — H6692 Otitis media, unspecified, left ear: Secondary | ICD-10-CM | POA: Diagnosis not present

## 2017-06-09 DIAGNOSIS — Z0001 Encounter for general adult medical examination with abnormal findings: Secondary | ICD-10-CM | POA: Diagnosis not present

## 2017-06-09 DIAGNOSIS — Z6834 Body mass index (BMI) 34.0-34.9, adult: Secondary | ICD-10-CM | POA: Diagnosis not present

## 2017-06-09 DIAGNOSIS — M545 Low back pain: Secondary | ICD-10-CM | POA: Diagnosis not present

## 2017-06-22 DIAGNOSIS — M7062 Trochanteric bursitis, left hip: Secondary | ICD-10-CM | POA: Diagnosis not present

## 2017-06-23 ENCOUNTER — Encounter (INDEPENDENT_AMBULATORY_CARE_PROVIDER_SITE_OTHER): Payer: Self-pay | Admitting: *Deleted

## 2017-06-29 DIAGNOSIS — Z96643 Presence of artificial hip joint, bilateral: Secondary | ICD-10-CM | POA: Diagnosis not present

## 2017-06-29 DIAGNOSIS — M7062 Trochanteric bursitis, left hip: Secondary | ICD-10-CM | POA: Diagnosis not present

## 2017-06-29 DIAGNOSIS — M25552 Pain in left hip: Secondary | ICD-10-CM | POA: Diagnosis not present

## 2017-07-05 DIAGNOSIS — Z96643 Presence of artificial hip joint, bilateral: Secondary | ICD-10-CM | POA: Diagnosis not present

## 2017-07-05 DIAGNOSIS — M25552 Pain in left hip: Secondary | ICD-10-CM | POA: Diagnosis not present

## 2017-07-05 DIAGNOSIS — M7062 Trochanteric bursitis, left hip: Secondary | ICD-10-CM | POA: Diagnosis not present

## 2017-07-22 DIAGNOSIS — Z6833 Body mass index (BMI) 33.0-33.9, adult: Secondary | ICD-10-CM | POA: Diagnosis not present

## 2017-07-22 DIAGNOSIS — J3089 Other allergic rhinitis: Secondary | ICD-10-CM | POA: Diagnosis not present

## 2017-07-22 DIAGNOSIS — E6609 Other obesity due to excess calories: Secondary | ICD-10-CM | POA: Diagnosis not present

## 2017-11-02 ENCOUNTER — Encounter (INDEPENDENT_AMBULATORY_CARE_PROVIDER_SITE_OTHER): Payer: Self-pay | Admitting: *Deleted

## 2017-11-04 ENCOUNTER — Other Ambulatory Visit (INDEPENDENT_AMBULATORY_CARE_PROVIDER_SITE_OTHER): Payer: Self-pay | Admitting: *Deleted

## 2017-11-04 DIAGNOSIS — Z1211 Encounter for screening for malignant neoplasm of colon: Secondary | ICD-10-CM

## 2018-01-04 ENCOUNTER — Encounter (INDEPENDENT_AMBULATORY_CARE_PROVIDER_SITE_OTHER): Payer: Self-pay | Admitting: *Deleted

## 2018-01-04 ENCOUNTER — Telehealth (INDEPENDENT_AMBULATORY_CARE_PROVIDER_SITE_OTHER): Payer: Self-pay | Admitting: *Deleted

## 2018-01-04 NOTE — Telephone Encounter (Signed)
Patient needs suprep 

## 2018-01-05 MED ORDER — SUPREP BOWEL PREP KIT 17.5-3.13-1.6 GM/177ML PO SOLN: 1.0000 | Freq: Once | ORAL | 0 refills | Status: AC

## 2018-01-14 ENCOUNTER — Telehealth (INDEPENDENT_AMBULATORY_CARE_PROVIDER_SITE_OTHER): Payer: Self-pay | Admitting: *Deleted

## 2018-01-14 NOTE — Telephone Encounter (Signed)
Referring MD/PCP: fusco   Procedure: tcs  Reason/Indication:  screening  Has patient had this procedure before?  no  If so, when, by whom and where?    Is there a family history of colon cancer?  no  Who?  What age when diagnosed?    Is patient diabetic?   no      Does patient have prosthetic heart valve or mechanical valve?  no  Do you have a pacemaker?  no  Has patient ever had endocarditis? no  Has patient had joint replacement within last 12 months?  no  Is patient constipated or do they take laxatives? no  Does patient have a history of alcohol/drug use?  no  Is patient on blood thinner such as Coumadin, Plavix and/or Aspirin? no  Medications: simvastatin 20 mg daily, allopurinol 300 mg daily, valsartan/hctz 80/12.5 mg daily, meloxicam 15 mg prn  Allergies: nkda  Medication Adjustment per Dr Lindi Adie, NP:   Procedure date & time: 02/10/18 at 74

## 2018-01-17 NOTE — Telephone Encounter (Signed)
agree

## 2018-02-10 ENCOUNTER — Encounter (HOSPITAL_COMMUNITY): Payer: Self-pay

## 2018-02-10 ENCOUNTER — Other Ambulatory Visit: Payer: Self-pay

## 2018-02-10 ENCOUNTER — Encounter (HOSPITAL_COMMUNITY)
Admission: RE | Disposition: A | Discharge status: Home / Self Care | Payer: Self-pay | Source: Ambulatory Visit | Attending: Internal Medicine

## 2018-02-10 ENCOUNTER — Ambulatory Visit (HOSPITAL_COMMUNITY)
Admission: RE | Admit: 2018-02-10 | Discharge: 2018-02-10 | Disposition: A | Discharge status: Home / Self Care | Payer: 59 | Source: Ambulatory Visit | Attending: Internal Medicine | Admitting: Internal Medicine

## 2018-02-10 DIAGNOSIS — Z79899 Other long term (current) drug therapy: Secondary | ICD-10-CM | POA: Insufficient documentation

## 2018-02-10 DIAGNOSIS — M109 Gout, unspecified: Secondary | ICD-10-CM | POA: Diagnosis not present

## 2018-02-10 DIAGNOSIS — I10 Essential (primary) hypertension: Secondary | ICD-10-CM | POA: Insufficient documentation

## 2018-02-10 DIAGNOSIS — K648 Other hemorrhoids: Secondary | ICD-10-CM | POA: Insufficient documentation

## 2018-02-10 DIAGNOSIS — M1990 Unspecified osteoarthritis, unspecified site: Secondary | ICD-10-CM | POA: Diagnosis not present

## 2018-02-10 DIAGNOSIS — Z1211 Encounter for screening for malignant neoplasm of colon: Secondary | ICD-10-CM | POA: Insufficient documentation

## 2018-02-10 DIAGNOSIS — Z96643 Presence of artificial hip joint, bilateral: Secondary | ICD-10-CM | POA: Insufficient documentation

## 2018-02-10 HISTORY — PX: COLONOSCOPY: SHX5424

## 2018-02-10 SURGERY — COLONOSCOPY
Anesthesia: Moderate Sedation

## 2018-02-10 MED ORDER — MEPERIDINE HCL 50 MG/ML IJ SOLN
INTRAMUSCULAR | Status: AC
  Filled 2018-02-10: qty 1

## 2018-02-10 MED ORDER — SODIUM CHLORIDE 0.9 % IV SOLN
INTRAVENOUS | Status: DC
  Administered 2018-02-10: 07:00:00 via INTRAVENOUS

## 2018-02-10 MED ORDER — MIDAZOLAM HCL 5 MG/5ML IJ SOLN
INTRAMUSCULAR | Status: AC
  Filled 2018-02-10: qty 10

## 2018-02-10 MED ORDER — MEPERIDINE HCL 50 MG/ML IJ SOLN
INTRAMUSCULAR | Status: DC | PRN
  Administered 2018-02-10 (×2): 25 mg via INTRAVENOUS

## 2018-02-10 MED ORDER — SIMETHICONE 40 MG/0.6ML PO SUSP
ORAL | Status: AC
  Filled 2018-02-10: qty 30

## 2018-02-10 MED ORDER — MIDAZOLAM HCL 5 MG/5ML IJ SOLN
INTRAMUSCULAR | Status: DC | PRN
  Administered 2018-02-10: 3 mg via INTRAVENOUS
  Administered 2018-02-10 (×2): 2 mg via INTRAVENOUS

## 2018-02-10 NOTE — H&P (Signed)
George Maldonado is an 52 y.o. male.   Chief Complaint: Patient is here for colonoscopy. HPI: Patient is 52 year old African-American male who is here for screening colonoscopy.  Patient denies abdominal pain change in bowel habits or rectal bleeding. Patient states he does not take any NSAIDs on regular basis.  He takes indomethacin only when he has acute gout.  For arthritis he may take meloxicam or Advil but not altogether. Family history is negative for CRC.  Past Medical History:  Diagnosis Date  . Arthritis   . GERD (gastroesophageal reflux disease)    occasionally  . Gout   . Heart murmur    as a child  . Hypertension     Past Surgical History:  Procedure Laterality Date  . NO PAST SURGERIES    . TOTAL HIP ARTHROPLASTY Right 08/01/2012   Procedure: TOTAL HIP ARTHROPLASTY;  Surgeon: Kerin Salen, MD;  Location: [facility];  Service: Orthopedics;  Laterality: Right;  DEPUY, NEEDS LONG TABLE PATIENT 6'8" 290 LBS  . TOTAL HIP ARTHROPLASTY Left 11/02/2016   Procedure: LEFT TOTAL HIP ARTHROPLASTY ANTERIOR APPROACH;  Surgeon: Frederik Pear, MD;  Location: [facility];  Service: Orthopedics;  Laterality: Left;    History reviewed. No pertinent family history. Social History:  reports that he has never smoked. He has never used smokeless tobacco. He reports that he does not drink alcohol or use drugs.  Allergies: No Known Allergies  Medications Prior to Admission  Medication Sig Dispense Refill  . allopurinol (ZYLOPRIM) 300 MG tablet Take 300 mg by mouth every evening. (1900)    . benazepril-hydrochlorthiazide (LOTENSIN HCT) 10-12.5 MG tablet Take 1 tablet by mouth every evening. (1900)  0  . ibuprofen (ADVIL,MOTRIN) 200 MG tablet Take 600 mg by mouth every 8 (eight) hours as needed for headache or mild pain.     . indomethacin (INDOCIN) 50 MG capsule Take 50 mg by mouth 3 (three) times daily as needed (for gout flares).    Marland Kitchen levocetirizine (XYZAL) 5 MG tablet Take 5 mg by mouth daily as  needed for allergies.    . meloxicam (MOBIC) 15 MG tablet Take 15 mg by mouth daily as needed for pain.     Vladimir Faster Glycol-Propyl Glycol (LUBRICANT EYE DROPS) 0.4-0.3 % SOLN Place 1 drop into both eyes 2 (two) times daily as needed (for dry/irritated eyes).    . simvastatin (ZOCOR) 20 MG tablet Take 20 mg by mouth every evening. (1900)    . SUPREP BOWEL PREP KIT 17.5-3.13-1.6 GM/177ML SOLN Take 354 mLs by mouth once.  0  . zolpidem (AMBIEN) 5 MG tablet Take 5 mg by mouth at bedtime as needed for sleep.      No results found for this or any previous visit (from the past 48 hour(s)). No results found.  ROS  Blood pressure 126/78, pulse (!) 51, temperature 98.8 F (37.1 C), temperature source Oral, resp. rate (!) 21, height '6\' 8"'  (2.032 m), weight 136.1 kg, SpO2 100 %. Physical Exam  Constitutional: He appears well-developed and well-nourished.  HENT:  Mouth/Throat: Oropharynx is clear and moist.  Eyes: Conjunctivae are normal. No scleral icterus.  Neck: No thyromegaly present.  Cardiovascular: Normal rate, regular rhythm and normal heart sounds.  No murmur heard. Respiratory: Effort normal and breath sounds normal.  GI: Soft. He exhibits no distension and no mass. There is no tenderness.  Musculoskeletal: He exhibits no edema.  Lymphadenopathy:    He has no cervical adenopathy.  Neurological: He is alert.  Skin: Skin is warm and dry.     Assessment/Plan Average or screening colonoscopy.  Hildred Laser, MD 02/10/2018, 7:29 AM

## 2018-02-10 NOTE — Op Note (Signed)
[facility] Patient Name: George Maldonado Procedure Date: 02/10/2018 7:07 AM MRN: [id number] Date of Birth: 05-29-1965 Attending MD: Hildred Laser , MD CSN: [id number] Age: 52 Admit Type: Outpatient Procedure:                Colonoscopy Indications:              Screening for colorectal malignant neoplasm Providers:                Hildred Laser, MD, Janeece Riggers, RN, Lurline Del, RN Referring MD:             Halford Chessman, MD Medicines:                Meperidine 50 mg IV, Midazolam 7 mg IV Complications:            No immediate complications. Estimated Blood Loss:     Estimated blood loss: none. Procedure:                Pre-Anesthesia Assessment:                           - Prior to the procedure, a History and Physical                            was performed, and patient medications and                            allergies were reviewed. The patient's tolerance of                            previous anesthesia was also reviewed. The risks                            and benefits of the procedure and the sedation                            options and risks were discussed with the patient.                            All questions were answered, and informed consent                            was obtained. Prior Anticoagulants: The patient                            last took previous NSAID medication 7 days prior to                            the procedure. ASA Grade Assessment: II - A patient                            with mild systemic disease. After reviewing the                            risks and benefits, the patient was deemed in  satisfactory condition to undergo the procedure.                           After obtaining informed consent, the colonoscope                            was passed under direct vision. Throughout the                            procedure, the patient's blood pressure, pulse, and                            oxygen  saturations were monitored continuously. The                            [id number]-[id number] ([id number]) scope was introduced through                            the anus and advanced to the the cecum, identified                            by appendiceal orifice and ileocecal valve. The                            colonoscopy was performed without difficulty. The                            patient tolerated the procedure well. The quality                            of the bowel preparation was good. The ileocecal                            valve, appendiceal orifice, and rectum were                            photographed. Scope In: 7:39:01 AM Scope Out: 7:51:34 AM Scope Withdrawal Time: 0 hours 9 minutes 15 seconds  Total Procedure Duration: 0 hours 12 minutes 33 seconds  Findings:      The perianal and digital rectal examinations were normal.      The colon (entire examined portion) appeared normal.      Internal hemorrhoids were found during retroflexion. The hemorrhoids       were small. Impression:               - The entire examined colon is normal.                           - Internal hemorrhoids.                           - No specimens collected. Moderate Sedation:      Moderate (conscious) sedation was administered by the endoscopy nurse       and supervised by the endoscopist. The following parameters were       monitored:  oxygen saturation, heart rate, blood pressure, CO2       capnography and response to care. Total physician intraservice time was       19 minutes. Recommendation:           - Patient has a contact number available for                            emergencies. The signs and symptoms of potential                            delayed complications were discussed with the                            patient. Return to normal activities tomorrow.                            Written discharge instructions were provided to the                            patient.                            - Resume previous diet today.                           - Continue present medications.                           - Repeat colonoscopy in 10 years for screening                            purposes. Procedure Code(s):        --- Professional ---                           [phone], Colonoscopy, flexible; diagnostic, including                            collection of specimen(s) by brushing or washing,                            when performed (separate procedure)                           G0500, Moderate sedation services provided by the                            same physician or other qualified health care                            professional performing a gastrointestinal                            endoscopic service that sedation supports,  requiring the presence of an independent trained                            observer to assist in the monitoring of the                            patient's level of consciousness and physiological                            status; initial 15 minutes of intra-service time;                            patient age 85 years or older (additional time may                            be reported with [phone], as appropriate) Diagnosis Code(s):        --- Professional ---                           Z12.11, Encounter for screening for malignant                            neoplasm of colon                           K64.8, Other hemorrhoids CPT copyright 2018 American Medical Association. All rights reserved. The codes documented in this report are preliminary and upon coder review may  be revised to meet current compliance requirements. Hildred Laser, MD Hildred Laser, MD 02/10/2018 8:02:28 AM This report has been signed electronically. Number of Addenda: 0

## 2018-02-10 NOTE — Discharge Instructions (Signed)
Resume usual medications as before.  Remember not to take 2 NSAIDs at the same time. Resume usual diet. No driving for 24 hours. Next screening exam in 10 years.   Colonoscopy, Adult, Care After This sheet gives you information about how to care for yourself after your procedure. Your health care provider may also give you more specific instructions. If you have problems or questions, contact your health care provider. What can I expect after the procedure? After the procedure, it is common to have:  A small amount of blood in your stool for 24 hours after the procedure.  Some gas.  Mild abdominal cramping or bloating.  Follow these instructions at home: General instructions   For the first 24 hours after the procedure: ? Do not drive or use machinery. ? Do not sign important documents. ? Do not drink alcohol. ? Do your regular daily activities at a slower pace than normal. ? Eat soft, easy-to-digest foods. ? Rest often.  Take over-the-counter or prescription medicines only as told by your health care provider.  It is up to you to get the results of your procedure. Ask your health care provider, or the department performing the procedure, when your results will be ready. Relieving cramping and bloating  Try walking around when you have cramps or feel bloated.  Apply heat to your abdomen as told by your health care provider. Use a heat source that your health care provider recommends, such as a moist heat pack or a heating pad. ? Place a towel between your skin and the heat source. ? Leave the heat on for 20-30 minutes. ? Remove the heat if your skin turns bright red. This is especially important if you are unable to feel pain, heat, or cold. You may have a greater risk of getting burned. Eating and drinking  Drink enough fluid to keep your urine clear or pale yellow.  Resume your normal diet as instructed by your health care provider. Avoid heavy or fried foods that are  hard to digest.  Avoid drinking alcohol for as long as instructed by your health care provider. Contact a health care provider if:  You have blood in your stool 2-3 days after the procedure. Get help right away if:  You have more than a small spotting of blood in your stool.  You pass large blood clots in your stool.  Your abdomen is swollen.  You have nausea or vomiting.  You have a fever.  You have increasing abdominal pain that is not relieved with medicine. This information is not intended to replace advice given to you by your health care provider. Make sure you discuss any questions you have with your health care provider. Document Released: 11/05/2003 Document Revised: 12/16/2015 Document Reviewed: 06/04/2015 Elsevier Interactive Patient Education  [employer].

## 2018-02-16 ENCOUNTER — Encounter (HOSPITAL_COMMUNITY): Payer: Self-pay | Admitting: Internal Medicine

## 2018-03-24 DIAGNOSIS — Z6833 Body mass index (BMI) 33.0-33.9, adult: Secondary | ICD-10-CM | POA: Diagnosis not present

## 2018-03-24 DIAGNOSIS — J209 Acute bronchitis, unspecified: Secondary | ICD-10-CM | POA: Diagnosis not present

## 2018-03-24 DIAGNOSIS — J069 Acute upper respiratory infection, unspecified: Secondary | ICD-10-CM | POA: Diagnosis not present

## 2018-03-24 DIAGNOSIS — E6609 Other obesity due to excess calories: Secondary | ICD-10-CM | POA: Diagnosis not present

## 2019-03-15 ENCOUNTER — Other Ambulatory Visit (HOSPITAL_COMMUNITY): Payer: Self-pay | Admitting: Orthopedic Surgery

## 2019-03-15 ENCOUNTER — Other Ambulatory Visit: Payer: Self-pay | Admitting: Orthopedic Surgery

## 2019-03-15 DIAGNOSIS — M25552 Pain in left hip: Secondary | ICD-10-CM

## 2019-03-23 ENCOUNTER — Encounter (HOSPITAL_COMMUNITY)
Admission: RE | Admit: 2019-03-23 | Discharge: 2019-03-23 | Disposition: A | Discharge status: Home / Self Care | Payer: 59 | Source: Ambulatory Visit | Attending: Orthopedic Surgery | Admitting: Orthopedic Surgery

## 2019-03-23 ENCOUNTER — Other Ambulatory Visit: Payer: Self-pay

## 2019-03-23 DIAGNOSIS — M25552 Pain in left hip: Secondary | ICD-10-CM | POA: Diagnosis present

## 2019-03-23 MED ORDER — TECHNETIUM TC 99M MEDRONATE IV KIT
20.0000 | PACK | Freq: Once | INTRAVENOUS | Status: AC | PRN
  Administered 2019-03-23: 20 via INTRAVENOUS

## 2019-04-04 ENCOUNTER — Ambulatory Visit: Payer: 59 | Attending: Internal Medicine

## 2019-04-04 ENCOUNTER — Other Ambulatory Visit: Payer: Self-pay

## 2019-04-04 DIAGNOSIS — Z20822 Contact with and (suspected) exposure to covid-19: Secondary | ICD-10-CM

## 2019-04-05 ENCOUNTER — Telehealth: Payer: Self-pay | Admitting: *Deleted

## 2019-04-05 ENCOUNTER — Telehealth: Payer: Self-pay

## 2019-04-05 LAB — NOVEL CORONAVIRUS, NAA: SARS-CoV-2, NAA: DETECTED — AB

## 2019-04-05 NOTE — Telephone Encounter (Signed)
Calling for covid results, not resulted as of yet, active. Verbalizes understanding.

## 2019-04-05 NOTE — Telephone Encounter (Signed)
Patent's spouse, Malachy Mood, called in requesting assistance on reading [place] lab results - spoke to patient - received verbal consent to speak freely in front on spouse - DOB/Address verified - Positive results confirmed. Reviewed Patient Positive Test results and MyChart Companion algorithm for Home Monitoring of [place] with patient and spouse.  Patient reports the following symptoms: headache and nasal congestion for the past week. Suggested OTC Tylenol ES 500 mg and Mucinex per manufacturer's instructions.   Patient/Spouse verbalized understanding, no further questions.

## 2019-04-13 ENCOUNTER — Other Ambulatory Visit: Payer: 59

## 2019-04-20 ENCOUNTER — Other Ambulatory Visit: Payer: Self-pay

## 2019-04-20 ENCOUNTER — Ambulatory Visit: Payer: 59 | Attending: Internal Medicine

## 2019-04-20 DIAGNOSIS — Z20822 Contact with and (suspected) exposure to covid-19: Secondary | ICD-10-CM

## 2019-04-22 LAB — NOVEL CORONAVIRUS, NAA: SARS-CoV-2, NAA: NOT DETECTED

## 2019-12-06 ENCOUNTER — Other Ambulatory Visit: Payer: Self-pay

## 2019-12-06 ENCOUNTER — Ambulatory Visit (INDEPENDENT_AMBULATORY_CARE_PROVIDER_SITE_OTHER): Payer: 59 | Admitting: Cardiovascular Disease

## 2019-12-06 ENCOUNTER — Encounter: Payer: Self-pay | Admitting: Cardiovascular Disease

## 2019-12-06 VITALS — BP 148/78 | HR 53 | Ht >= 80 in | Wt 312.0 lb

## 2019-12-06 DIAGNOSIS — E7800 Pure hypercholesterolemia, unspecified: Secondary | ICD-10-CM

## 2019-12-06 DIAGNOSIS — R55 Syncope and collapse: Secondary | ICD-10-CM | POA: Diagnosis not present

## 2019-12-06 DIAGNOSIS — E668 Other obesity: Secondary | ICD-10-CM

## 2019-12-06 DIAGNOSIS — M25552 Pain in left hip: Secondary | ICD-10-CM

## 2019-12-06 DIAGNOSIS — M25551 Pain in right hip: Secondary | ICD-10-CM

## 2019-12-06 DIAGNOSIS — I10 Essential (primary) hypertension: Secondary | ICD-10-CM

## 2019-12-06 MED ORDER — BENAZEPRIL HCL 10 MG PO TABS: 10.0000 mg | ORAL_TABLET | Freq: Every day | ORAL | 5 refills | Status: DC

## 2019-12-06 MED ORDER — AMLODIPINE BESYLATE 2.5 MG PO TABS: 2.5000 mg | ORAL_TABLET | Freq: Every day | ORAL | 5 refills | Status: DC

## 2019-12-06 MED ORDER — ATORVASTATIN CALCIUM 40 MG PO TABS: 40.0000 mg | ORAL_TABLET | Freq: Every day | ORAL | 5 refills | Status: DC

## 2019-12-06 NOTE — Patient Instructions (Addendum)
Medication Instructions:  STOP the Benazepril-HCTZ STOP the Simvastatin  START Amlodipine 2.5 mg mg once daily START Benazepril 10 mg once daily START Atorvastatin 40 mg once daily  *If you need a refill on your cardiac medications before your next appointment, please call your pharmacy*   Lab Work: Your provider would like for you to return in 3 months to have the following labs drawn: Fasting Lipid. You do not need an appointment for the lab. Once in our office lobby there is a podium where you can sign in and ring the doorbell to alert [place] that you are here. The lab is open from 8:00 am to 4:30 pm; closed for lunch from 12:45pm-1:45pm.  If you have labs (blood work) drawn today and your tests are completely normal, you will receive your results only by: Marland Kitchen MyChart Message (if you have MyChart) OR . A paper copy in the mail If you have any lab test that is abnormal or we need to change your treatment, we will call you to review the results.   Testing/Procedures: Your physician has requested that you have a carotid duplex. This test is an ultrasound of the carotid arteries in your neck. It looks at blood flow through these arteries that supply the brain with blood. Allow one hour for this exam. There are no restrictions or special instructions. This will take place at [place], [address].  Follow-Up: At [facility], you and your health needs are our priority.  As part of our continuing mission to provide you with exceptional heart care, we have created designated Provider Care Teams.  These Care Teams include your primary Cardiologist (physician) and Advanced Practice Providers (APPs -  Physician Assistants and Nurse Practitioners) who all work together to provide you with the care you need, when you need it.  We recommend signing up for the patient portal called "MyChart".  Sign up information is provided on this After Visit Summary.  MyChart is used to connect with patients  for Virtual Visits (Telemedicine).  Patients are able to view lab/test results, encounter notes, upcoming appointments, etc.  Non-urgent messages can be sent to your provider as well.   To learn more about what you can do with MyChart, go to [email or web address].    Your next appointment:   6 month(s)  The format for your next appointment:   In Person  Provider:   You may see Sanda Klein, MD or one of the following Advanced Practice Providers on your designated Care Team:    Almyra Deforest, PA-C  Fabian Sharp, [place] or   Roby Lofts, [place]   Other Instructions Dr. Sallyanne Kuster would like you to check your blood pressure daily for the next 2 weeks.  Keep a journal of these daily blood pressure and heart rate readings and call our office or send a message through [place] with the results. Thank you!

## 2019-12-06 NOTE — Consult Note (Signed)
Cardiology Consultation:   Patient ID: George Maldonado MRN: [id number]; DOB: June 17, 1965  Admit date: (Not on file) Date of Consult: 12/07/2019  Primary Care Provider: Sharilyn Sites, MD [facility] Cardiologist: Sanda Klein, MD [facility] Electrophysiologist:  None    Patient Profile:   George Maldonado is a 54 y.o. male with a hx of HTN and HLP who is being seen today for the evaluation of dizziness/near syncopal at the request of Rowan Blase, NP.  History of Present Illness:   [name] has essential combination ACE inhibitor/thiazide diuretic and a statin. He also has a history of gout and hyperuricemia, treated with allopurinol. He has recently had several episodes of dizziness. The most serious event occurred when he was walking in the stores at [facility] home improvement and felt very lightheaded. It was a very hot day and he had probably not had enough to eat and drink. He felt he was very close to passing out. He went back to his truck and turned the air conditioning on and felt better after about 30 minutes. He is also had several episodes of dizziness when he bends over and quickly stands back up. He does not have problems with palpitations. Otherwise he is able to perform intense physical activity, both at home and at work, without limitations related to chest pain or shortness of breath. He denies lower extremity edema, orthopnea, PND. He has not had chest pain at rest or with activity and does not have intermittent claudication. He denies other focal neurological complaints. He does complain of excessive urination. This happens all day and all night. He actually stopped taking his antihypertensive medication over the last 2-day and has noted a marked reduction in urine output.  He has a brother on hemodialysis. He has several family members with heart disease, including his father, but the onset sas very late in life.   Past Medical History:  Diagnosis Date   . Arthritis   . GERD (gastroesophageal reflux disease)    occasionally  . Gout   . Heart murmur    as a child  . Hypertension     Past Surgical History:  Procedure Laterality Date  . COLONOSCOPY N/A 02/10/2018   Procedure: COLONOSCOPY;  Surgeon: Rogene Houston, MD;  [facility];  Service: Endoscopy;  Laterality: N/A;  730  . NO PAST SURGERIES    . TOTAL HIP ARTHROPLASTY Right 08/01/2012   Procedure: TOTAL HIP ARTHROPLASTY;  Surgeon: Kerin Salen, MD;  Location: [facility];  Service: Orthopedics;  Laterality: Right;  DEPUY, NEEDS LONG TABLE PATIENT 6'8" 290 LBS  . TOTAL HIP ARTHROPLASTY Left 11/02/2016   Procedure: LEFT TOTAL HIP ARTHROPLASTY ANTERIOR APPROACH;  Surgeon: Frederik Pear, MD;  Location: [facility];  Service: Orthopedics;  Laterality: Left;     Home Medications:  Prior to Admission medications   Medication Sig Start Date End Date Taking? Authorizing Provider  allopurinol (ZYLOPRIM) 300 MG tablet Take 300 mg by mouth every evening. (1900)    [provider]  amLODipine (NORVASC) 2.5 MG tablet Take 1 tablet (2.5 mg total) by mouth daily. 12/06/19 03/05/20  Blanche Scovell, MD  atorvastatin (LIPITOR) 40 MG tablet Take 1 tablet (40 mg total) by mouth daily. 12/06/19 03/05/20  Elane Peabody, MD  benazepril (LOTENSIN) 10 MG tablet Take 1 tablet (10 mg total) by mouth daily. 12/06/19   Takirah Binford, MD  ibuprofen (ADVIL,MOTRIN) 200 MG tablet Take 600 mg by mouth every 8 (eight) hours as needed  for headache or mild pain.     [provider]  levocetirizine (XYZAL) 5 MG tablet Take 5 mg by mouth daily as needed for allergies.    [provider]  meloxicam (MOBIC) 15 MG tablet Take 15 mg by mouth daily as needed for pain.  12/28/16   [provider]  Polyethyl Glycol-Propyl Glycol (LUBRICANT EYE DROPS) 0.4-0.3 % SOLN Place 1 drop into both eyes 2 (two) times daily as needed (for dry/irritated eyes).    [provider]  SUPREP BOWEL PREP  KIT 17.5-3.13-1.6 GM/177ML SOLN Take 354 mLs by mouth once. 01/05/18   [provider]  zolpidem (AMBIEN) 5 MG tablet Take 5 mg by mouth at bedtime as needed for sleep.    [provider]    Allergies:   No Known Allergies  Social History:   Social History   Socioeconomic History  . Marital status: Married    Spouse name: Not on file  . Number of children: Not on file  . Years of education: Not on file  . Highest education level: Not on file  Occupational History  . Not on file  Tobacco Use  . Smoking status: Never Smoker  . Smokeless tobacco: Never Used  Substance and Sexual Activity  . Alcohol use: No  . Drug use: No  . Sexual activity: Not on file  Other Topics Concern  . Not on file  Social History Narrative  . Not on file   Social Determinants of Health   Financial Resource Strain:   . Difficulty of Paying Living Expenses: Not on file  Food Insecurity:   . Worried About Charity fundraiser in the Last Year: Not on file  . Ran Out of Food in the Last Year: Not on file  Transportation Needs:   . Lack of Transportation (Medical): Not on file  . Lack of Transportation (Non-Medical): Not on file  Physical Activity:   . Days of Exercise per Week: Not on file  . Minutes of Exercise per Session: Not on file  Stress:   . Feeling of Stress : Not on file  Social Connections:   . Frequency of Communication with Friends and Family: Not on file  . Frequency of Social Gatherings with Friends and Family: Not on file  . Attends Religious Services: Not on file  . Active Member of Clubs or Organizations: Not on file  . Attends Archivist Meetings: Not on file  . Marital Status: Not on file  Intimate Partner Violence:   . Fear of Current or Ex-Partner: Not on file  . Emotionally Abused: Not on file  . Physically Abused: Not on file  . Sexually Abused: Not on file    Family History:   Family History  Problem Relation Age of Onset  . Heart disease  Father   . Kidney disease Brother      ROS:  Please see the history of present illness.  All other ROS reviewed and negative.     Physical Exam/Data:   Vitals:   12/06/19 0850  BP: (!) 148/78  Pulse: (!) 53  Weight: (!) 312 lb (141.5 kg)  Height: _0  (2.032 m)   _1 @ Last 3 Weights 12/06/2019 02/10/2018 11/02/2016  Weight (lbs) 312 lb 300 lb 301 lb  Weight (kg) 141.522 kg 136.079 kg 136.533 kg     Body mass index is 34.27 kg/m.  General:  Well nourished, well developed, in no acute distress. He appears fit and muscular  but is also clearly obese HEENT: normal Lymph: no adenopathy Neck: no JVD Endocrine:  No thryomegaly Vascular: No carotid bruits; FA pulses 2+ bilaterally without bruits  Cardiac:  normal S1, S2; RRR; no murmur  Lungs:  clear to auscultation bilaterally, no wheezing, rhonchi or rales  Abd: soft, nontender, no hepatomegaly  Ext: no edema Musculoskeletal:  No deformities, BUE and BLE strength normal and equal Skin: warm and dry  Neuro:  CNs 2-12 intact, no focal abnormalities noted Psych:  Normal affect   EKG:  The EKG was personally reviewed and demonstrates: Mild sinus bradycardia, otherwise normal tracing   Relevant CV Studies: No previous evaluation, but review of his CT performed for nephrolithiasis in 2018 did not show any evidence of vascular atherosclerotic calcifications.  Laboratory Data:  High Sensitivity Troponin:  No results for input(s): TROPONINIHS in the last 720 hours.   ChemistryNo results for input(s): NA, K, CL, CO2, GLUCOSE, BUN, CREATININE, CALCIUM, GFRNONAA, GFRAA, ANIONGAP in the last 168 hours.  No results for input(s): PROT, ALBUMIN, AST, ALT, ALKPHOS, BILITOT in the last 168 hours. HematologyNo results for input(s): WBC, RBC, HGB, HCT, MCV, MCH, MCHC, RDW, PLT in the last 168 hours. BNPNo results for input(s): BNP, PROBNP in the last 168 hours.  DDimer No results for input(s): DDIMER in the last 168  hours.  10/04/2019 Total cholesterol 194, HDL 56, LDL 123, triglycerides 80 Hemoglobin 15, potassium 4.2 (no creatinine level available) Radiology/Studies:  No results found. CT performed for nephrolithiasis in 2018 did not show any evidence of vascular atherosclerotic calcifications.  Assessment and Plan:   1. Near syncope   2. Essential hypertension   3. Pure hypercholesterolemia   4. Moderate obesity   5. Hip pain, bilateral      1. Dizziness/pre-syncope: The problems he describes sound highly suggestive of hypotension/orthostatic hypotension, probably in the setting of relative hypovolemia. We will try to control his blood pressure without thiazide diuretics (which also increase the risk of gout related complications). We will continue the benazepril and add amlodipine. Once we identify the adequate dosing can switch him to the benazepril/amlodipine combined tablet. Warned him about the potential for ankle edema is the most common side effect of amlodipine. 2. HTN: Blood pressure is now starting to rise, more than 2 days after his last dose of antihypertensive medication. Target 130/80, especially in view of his family history of end-stage renal disease. 3. HLP: LDL cholesterol remains a little high on current dose of simvastatin. Target LDL at most 100, preferably less than 70. We will switch from simvastatin (interaction with amlodipine and weaker drug) to atorvastatin. Recheck lipid profile in 3 months. 4. Obesity: Strongly encouraged weight loss. 5. Hip pain: He takes ibuprofen for hip pain (had bilateral arthroplasties). Asked him to avoid taking it frequently since this would increase his risk of renal complications and will increase his blood pressure. His NSAID use is another good reason to avoid diuretic therapy.   For questions or updates, please contact [place] Please consult [email or web address] for contact info under    Signed, Sanda Klein, MD  12/07/2019 1:13 PM

## 2019-12-07 ENCOUNTER — Encounter: Payer: Self-pay | Admitting: Cardiovascular Disease

## 2019-12-08 ENCOUNTER — Encounter: Payer: Self-pay | Admitting: *Deleted

## 2019-12-08 ENCOUNTER — Other Ambulatory Visit: Payer: Self-pay

## 2019-12-08 ENCOUNTER — Ambulatory Visit (HOSPITAL_COMMUNITY)
Admission: RE | Admit: 2019-12-08 | Discharge: 2019-12-08 | Disposition: A | Discharge status: Home / Self Care | Payer: 59 | Source: Ambulatory Visit | Attending: Cardiology | Admitting: Cardiology

## 2019-12-08 DIAGNOSIS — R55 Syncope and collapse: Secondary | ICD-10-CM

## 2019-12-08 DIAGNOSIS — E7800 Pure hypercholesterolemia, unspecified: Secondary | ICD-10-CM

## 2019-12-20 ENCOUNTER — Encounter (HOSPITAL_COMMUNITY): Payer: 59

## 2020-01-02 ENCOUNTER — Other Ambulatory Visit: Payer: Self-pay | Admitting: *Deleted

## 2020-01-02 MED ORDER — AMLODIPINE BESYLATE 5 MG PO TABS: 5.0000 mg | ORAL_TABLET | Freq: Every day | ORAL | 5 refills | Status: DC

## 2020-01-05 ENCOUNTER — Ambulatory Visit (INDEPENDENT_AMBULATORY_CARE_PROVIDER_SITE_OTHER): Payer: 59 | Admitting: Podiatry

## 2020-01-05 ENCOUNTER — Ambulatory Visit (INDEPENDENT_AMBULATORY_CARE_PROVIDER_SITE_OTHER): Payer: 59

## 2020-01-05 ENCOUNTER — Other Ambulatory Visit: Payer: Self-pay

## 2020-01-05 ENCOUNTER — Encounter: Payer: Self-pay | Admitting: Podiatry

## 2020-01-05 VITALS — BP 130/72 | HR 78 | Temp 98.3°F

## 2020-01-05 DIAGNOSIS — M779 Enthesopathy, unspecified: Secondary | ICD-10-CM

## 2020-01-05 DIAGNOSIS — M722 Plantar fascial fibromatosis: Secondary | ICD-10-CM

## 2020-01-05 MED ORDER — DICLOFENAC SODIUM 75 MG PO TBEC: 75.0000 mg | DELAYED_RELEASE_TABLET | Freq: Two times a day (BID) | ORAL | 2 refills | Status: AC

## 2020-01-05 NOTE — Patient Instructions (Signed)

## 2020-01-09 NOTE — Progress Notes (Signed)
Subjective:   Patient ID: George Maldonado, male   DOB: 54 y.o.   MRN: [id number]   HPI Patient presents with a lot of pain in the plantar aspect of the right heel at the insertional point of the tendon into the calcaneus.  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  Patient is moderately obese does not smoke likes to be active   Review of Systems  All other systems reviewed and are negative.       Objective:  Physical Exam Vitals and nursing note reviewed.  Constitutional:      Appearance: He is well-developed.  Pulmonary:     Effort: Pulmonary effort is normal.  Musculoskeletal:        General: Normal range of motion.  Skin:    General: Skin is warm.  Neurological:     Mental Status: He is alert.     Neurovascular status intact muscle strength was found to be adequate range of motion within normal limits.  Patient is found to have exquisite discomfort plantar aspect heel right at the insertional point tendon to the calcaneus with inflammation fluid around the medial band.-Patient is found to have good digital perfusion well oriented x3 with moderate flatfoot and obesity     Assessment:  Acute plantar fasciitis right inflammation fluid buildup noted     Plan:  H&P x-ray reviewed conditions discussed.  Today I went ahead I did sterile prep and injected the plantar fascial right 3 mg Kenalog 5 mg Xylocaine applied fascial brace to lift up the arch with instructions on stretching exercises and placed on anti-inflammatory.  Gave instructions for shoe gear modification reappoint to recheck  X-rays indicate that there is spur formation with moderate depression of the arch noted right

## 2020-01-16 ENCOUNTER — Telehealth: Payer: Self-pay | Admitting: Cardiovascular Disease

## 2020-01-16 MED ORDER — BENAZEPRIL HCL 10 MG PO TABS: 10.0000 mg | ORAL_TABLET | Freq: Every day | ORAL | 3 refills | Status: DC

## 2020-01-16 MED ORDER — AMLODIPINE BESYLATE 10 MG PO TABS: 10.0000 mg | ORAL_TABLET | Freq: Every day | ORAL | 3 refills | Status: DC

## 2020-01-16 NOTE — Telephone Encounter (Signed)
Thanks. Please increase the amlodipine to 10 mg daily and expect BP to gradually improve - may take over a week for the full effect of the med change. Please send BP readings in one week by mychart or by phone. Thanks.

## 2020-01-16 NOTE — Telephone Encounter (Signed)
Pt c/o medication issue:  1. Name of Medication: amLODipine (NORVASC) 5 MG tablet    2. How are you currently taking this medication (dosage and times per day)? Take 1 tablet (5 mg total) by mouth daily.  3. Are you having a reaction (difficulty breathing--STAT)? no  4. What is your medication issue? Pt stated that his bp meds was changed on his last visit and then the dose was changed to 5mg  .  He stated that his bp is still running high and is concerned.  Please advise   Best number [phone]  Yesterday -169/106 Today - 144/92

## 2020-01-16 NOTE — Telephone Encounter (Signed)
Called patient to let him know of Dr. Victorino December recommendations. Patient stated he is taking benazepril 10 mg and amlodipine 5 mg. Added benazepril to patient's list, so it is up to date. Will send message back to Dr. Sallyanne Kuster for advisement since patient is taking both medications.

## 2020-01-16 NOTE — Telephone Encounter (Signed)
Called patient back about message. Patient stated ever since he started taking amlodipine his BP has gone up. Will send message to Dr. Sallyanne Kuster for advisement.

## 2020-01-16 NOTE — Telephone Encounter (Signed)
We stopped the benazepril-HCTZ combo to get rid of the diuretic, but he was supposed to be on plain benazepril, which I no longer see on his list. He should be on amlodipine 5 mg daily AND benazepril 10 mg daily. Please confirm. Re-prescribe the benazepril if he is not taking it, please.

## 2020-01-16 NOTE — Telephone Encounter (Signed)
Called patient back with Dr. Victorino December recommendations. Patient will increase his amlodipine to 10 mg daily and keep a log of his BP and let us know in a week of his readings.

## 2020-01-19 ENCOUNTER — Ambulatory Visit (INDEPENDENT_AMBULATORY_CARE_PROVIDER_SITE_OTHER): Payer: 59 | Admitting: Podiatry

## 2020-01-19 ENCOUNTER — Encounter: Payer: Self-pay | Admitting: Podiatry

## 2020-01-19 ENCOUNTER — Other Ambulatory Visit: Payer: Self-pay

## 2020-01-19 DIAGNOSIS — M722 Plantar fascial fibromatosis: Secondary | ICD-10-CM

## 2020-01-22 NOTE — Progress Notes (Signed)
Subjective:   Patient ID: George Maldonado, male   DOB: 54 y.o.   MRN: [id number]   HPI Patient states my foot is feeling a lot better but I am on cement floors 12-hour days and on my left one I also need support   ROS      Objective:  Physical Exam  Neurovascular status intact with patient's heel and arch feeling much better with inflammation noted upon deep palpation but significant improvement from previous visit.  Patient does have moderate flatfoot deformity which is contributory     Assessment:  Acute plantar fasciitis improved with moderate flatfoot deformity noted     Plan:  H&P reviewed conditions at great length and discussed different treatment options and the fact that his activity levels and his weight and height are contributory to the problem he has with cement floors.  At this point I did dispense a second fascial brace for the left and we discussed long-term orthotics depending on symptoms or other treatments which I educated him on today.  Patient is currently discharged will be seen back as needed

## 2020-02-20 ENCOUNTER — Other Ambulatory Visit: Payer: Self-pay | Admitting: Physician Assistant

## 2020-02-20 DIAGNOSIS — I10 Essential (primary) hypertension: Secondary | ICD-10-CM

## 2020-02-20 MED ORDER — BENAZEPRIL HCL 20 MG PO TABS: 20.0000 mg | ORAL_TABLET | Freq: Every day | ORAL | 3 refills | Status: DC

## 2020-02-20 MED ORDER — AMLODIPINE BESYLATE 10 MG PO TABS: 10.0000 mg | ORAL_TABLET | Freq: Every day | ORAL | 3 refills | Status: DC

## 2020-03-28 ENCOUNTER — Telehealth: Payer: Self-pay | Admitting: Cardiovascular Disease

## 2020-03-28 DIAGNOSIS — I10 Essential (primary) hypertension: Secondary | ICD-10-CM

## 2020-03-28 MED ORDER — AMLODIPINE BESYLATE 10 MG PO TABS: 10.0000 mg | ORAL_TABLET | Freq: Every day | ORAL | 3 refills | Status: DC

## 2020-03-28 NOTE — Telephone Encounter (Signed)
Spoke with patient who states that he needs his Amlodipine refilled with the updated 10mg  tablets. Per chart review patients Amlodipine was increased from 2.5mg  to 10mg  on 01/16/20. Advised patient that his prescription for Amlodipine 10mg  once daily has been sent in to the patient preferred pharmacy. Patient would also like to let Dr. Sallyanne Kuster know that his blood pressure has been consistently in the 140's/80's. Patient denies any symptoms at all but stated that Dr. Sallyanne Kuster would like his blood pressure 130/80. Advised patient to continue monitoring blood pressure daily, and that I would forward message to Dr. Loletha Grayer for any advice and recommendations.  Patient verbalized understanding.

## 2020-03-28 NOTE — Telephone Encounter (Signed)
*  STAT* If patient is at the pharmacy, call can be transferred to refill team.   1. Which medications need to be refilled? (please list name of each medication and dose if known)  amLODipine (NORVASC) 10 MG tablet  2. Which pharmacy/location (including street and city if local pharmacy) is medication to be sent to? [id number] [place], [place] - [address]  3. Do they need a 30 day or 90 day supply? 90   Patient said his pharmacy only has the 2.5 mg rx on file for him not the new higher dosage of 10 mg. Pt needs updated rx sent to his pharmacy

## 2020-04-01 NOTE — Telephone Encounter (Signed)
Please increase the benazepril to 40 mg daily, continue amlodipine 10 mg daily

## 2020-04-01 NOTE — Telephone Encounter (Signed)
Left a message for the patient to call back.  

## 2020-04-04 NOTE — Telephone Encounter (Signed)
Left a message for the patient to call back.  

## 2020-04-08 ENCOUNTER — Other Ambulatory Visit: Payer: Self-pay | Admitting: Cardiovascular Disease

## 2020-04-10 ENCOUNTER — Other Ambulatory Visit: Payer: Self-pay | Admitting: *Deleted

## 2020-04-10 MED ORDER — BENAZEPRIL HCL 40 MG PO TABS: 40.0000 mg | ORAL_TABLET | Freq: Every day | ORAL | 11 refills | Status: DC

## 2020-04-10 NOTE — Telephone Encounter (Signed)
Message was left with the wife, per dpr, with instructions per Dr. Royann Shivers.  She has been advised to have the patient to call back with any questions.

## 2020-06-05 ENCOUNTER — Other Ambulatory Visit: Payer: Self-pay

## 2020-06-05 ENCOUNTER — Ambulatory Visit (INDEPENDENT_AMBULATORY_CARE_PROVIDER_SITE_OTHER): Payer: 59 | Admitting: Cardiovascular Disease

## 2020-06-05 ENCOUNTER — Encounter: Payer: Self-pay | Admitting: Cardiovascular Disease

## 2020-06-05 VITALS — BP 135/82 | HR 57 | Ht >= 80 in | Wt 317.0 lb

## 2020-06-05 DIAGNOSIS — E7800 Pure hypercholesterolemia, unspecified: Secondary | ICD-10-CM

## 2020-06-05 DIAGNOSIS — E668 Other obesity: Secondary | ICD-10-CM

## 2020-06-05 DIAGNOSIS — I10 Essential (primary) hypertension: Secondary | ICD-10-CM

## 2020-06-05 LAB — BASIC METABOLIC PANEL
BUN/Creatinine Ratio: 12 (ref 9–20)
BUN: 11 mg/dL (ref 6–24)
CO2: 22 mmol/L (ref 20–29)
Calcium: 9.8 mg/dL (ref 8.7–10.2)
Chloride: 105 mmol/L (ref 96–106)
Creatinine, Ser: 0.94 mg/dL (ref 0.76–1.27)
Glucose: 84 mg/dL (ref 65–99)
Potassium: 4.2 mmol/L (ref 3.5–5.2)
Sodium: 142 mmol/L (ref 134–144)
eGFR: 96 mL/min/{1.73_m2} (ref >59)

## 2020-06-05 LAB — LIPID PANEL
Chol/HDL Ratio: 3.1 ratio (ref 0.0–5.0)
Cholesterol, Total: 172 mg/dL (ref 100–199)
HDL: 55 mg/dL
LDL Chol Calc (NIH): 103 mg/dL — ABNORMAL HIGH (ref 0–99)
Triglycerides: 76 mg/dL (ref 0–149)
VLDL Cholesterol Cal: 14 mg/dL (ref 5–40)

## 2020-06-05 NOTE — Progress Notes (Signed)
Cardiology Office Note:    Date:  06/07/2020   ID:  [place], DOB 1965-04-11, MRN [id number]  PCP:  Sharilyn Sites, [place]  Cardiologist:  Sanda Klein, MD  Advanced Practice Provider:  No care team member to display Electrophysiologist:  None   Referring MD: Sharilyn Sites, MD   Chief Complaint  Patient presents with  . Hypertension  . Loss of Consciousness         History of Present Illness:    George Maldonado is a 55 y.o. male with a hx of essential hypertension hyperlipidemia, gout and hyperuricemia, who had several episodes of near syncope and symptoms of orthostatic hypotension leading to a brief hospitalization last September.  At that time he was taking a thiazide diuretic.  Medications were adjusted to avoid the use of diuretics and since then he has not had any complaints of dizziness, syncope or any gout attacks.  He is a former basketball and football player and is very tall, but also moderately obese.  Had previous bilateral hip arthroplasties and has recurrent problems with pain in his hips.  He has gained weight since his previous appointment.  At home his blood pressure has typically been in the 140-145/80s range.  Today in our office his systolic blood pressure was 135 mmHg, which he says "is better than ever".  He tends to be relatively bradycardic with a heart rate in the 50s.  He is able to work hard without complaints of chest pain, shortness of breath, palpitations or any focal neurological complaints or claudication.  He has mild intermittent pretibial edema since taking amlodipine.  He denies problems with erectile dysfunction.  He has a lot of musculoskeletal aches and pains, particularly in his back and has been taking NSAIDs (his medicine list contains diclofenac but he has actually been taking meloxicam every day).  Past Medical History:  Diagnosis Date  . Arthritis   . GERD (gastroesophageal  reflux disease)    occasionally  . Gout   . Heart murmur    as a child  . Hypertension     Past Surgical History:  Procedure Laterality Date  . COLONOSCOPY N/A 02/10/2018   Procedure: COLONOSCOPY;  Surgeon: Rogene Houston, MD;  [facility];  Service: Endoscopy;  Laterality: N/A;  730  . NO PAST SURGERIES    . TOTAL HIP ARTHROPLASTY Right 08/01/2012   Procedure: TOTAL HIP ARTHROPLASTY;  Surgeon: Kerin Salen, MD;  Location: [place];  Service: Orthopedics;  Laterality: Right;  DEPUY, NEEDS LONG TABLE PATIENT 6'8" 290 LBS  . TOTAL HIP ARTHROPLASTY Left 11/02/2016   Procedure: LEFT TOTAL HIP ARTHROPLASTY ANTERIOR APPROACH;  Surgeon: Frederik Pear, MD;  Location: [place];  Service: Orthopedics;  Laterality: Left;    Current Medications: Current Meds  Medication Sig  . allopurinol (ZYLOPRIM) 300 MG tablet Take 300 mg by mouth every evening. (1900)  . amLODipine (NORVASC) 10 MG tablet Take 1 tablet (10 mg total) by mouth daily.  Marland Kitchen atorvastatin (LIPITOR) 40 MG tablet TAKE 1 TABLET BY MOUTH EVERY DAY  . benazepril (LOTENSIN) 40 MG tablet Take 1 tablet (40 mg total) by mouth daily.  . diclofenac (VOLTAREN) 75 MG EC tablet Take 1 tablet (75 mg total) by mouth 2 (two) times daily.     Allergies:   Patient has no known allergies.   Social History   Socioeconomic History  . Marital status: Married    Spouse name: Not  on file  . Number of children: Not on file  . Years of education: Not on file  . Highest education level: Not on file  Occupational History  . Not on file  Tobacco Use  . Smoking status: Never Smoker  . Smokeless tobacco: Never Used  Substance and Sexual Activity  . Alcohol use: No  . Drug use: No  . Sexual activity: Not on file  Other Topics Concern  . Not on file  Social History Narrative  . Not on file   Social Determinants of Health   Financial Resource Strain: Not on file  Food Insecurity: Not on file  Transportation Needs: Not on file  Physical  Activity: Not on file  Stress: Not on file  Social Connections: Not on file     Family History: The patient's family history includes Heart disease in his father; Kidney disease in his brother.  His brother was on hemodialysis  ROS:   Please see the history of present illness.     All other systems reviewed and are negative.  EKGs/Labs/Other Studies Reviewed:    The following studies were reviewed today:   EKG:  EKG is  ordered today.  The ekg ordered today demonstrates mild sinus bradycardia, otherwise completely normal tracing, QTC 406 ms  Recent Labs: 06/05/2020: BUN 11; Creatinine, Ser 0.94; Potassium 4.2; Sodium 142  10/04/2019 hemoglobin 15.0 Recent Lipid Panel    Component Value Date/Time   CHOL 172 06/05/2020 1002   TRIG 76 06/05/2020 1002   HDL 55 06/05/2020 1002   CHOLHDL 3.1 06/05/2020 1002   LDLCALC 103 (H) 06/05/2020 1002   10/04/2019 total cholesterol 194, HDL 56, triglycerides 80  Risk Assessment/Calculations:     Physical Exam:    VS:  BP 135/82   Pulse (!) 57   Ht 6\' 8"  (2.032 m)   Wt (!) 317 lb (143.8 kg)   SpO2 97%   BMI 34.82 kg/m     Wt Readings from Last 3 Encounters:  06/05/20 (!) 317 lb (143.8 kg)  12/06/19 (!) 312 lb (141.5 kg)  02/10/18 300 lb (136.1 kg)     GEN: Moderately obese, well nourished, well developed in no acute distress HEENT: Normal NECK: No JVD; No carotid bruits LYMPHATICS: No lymphadenopathy CARDIAC: RRR, no murmurs, rubs, gallops RESPIRATORY:  Clear to auscultation without rales, wheezing or rhonchi  ABDOMEN: Soft, non-tender, non-distended MUSCULOSKELETAL:  No edema; No deformity  SKIN: Warm and dry NEUROLOGIC:  Alert and oriented x 3 PSYCHIATRIC:  Normal affect   ASSESSMENT:    1. Essential hypertension   2. Pure hypercholesterolemia   3. Moderate obesity    PLAN:    In order of problems listed above:  1. HTN: His blood pressure is close to target 130/80 or lower, but not quite there yet.  He is on  maximum doses of calcium channel blocker and RAAS inhibitor.  We are trying to avoid diuretics due to his history of orthostatic near syncope and gout.  I am not sure he would tolerate beta-blockers well since his resting heart rate is in the 50s.  Rather than add more medicines, I think it would be wiser to double down on lifestyle changes that can help reduce his blood pressure such as weight loss, regular aerobic exercise, sodium restricted diet.  Most importantly, I think he needs to cut back on the daily use of NSAIDs, since then the long run these will also increase the risk of other complications such as renal insufficiency.  I asked him to stop using meloxicam on a daily basis.  Prefer acetaminophen for regular aches and pains, with occasional use of a shorter acting NSAID such as ibuprofen only when acetaminophen is ineffective. 2. Near syncope: No recurrence after discontinuation of the diuretic. 3. Hypercholesterolemia: Labs were checked after his appointment today.  After switching to atorvastatin his LDL level is markedly improved and very close to the target of 100 mg/deciliter or less.  Again, rather than adding more medications I would recommend focusing on weight loss and regular physical exercise. 4. Obesity: Strongly advised weight loss as this would help with his lipid profile, blood pressure readings, hip pain and overall quality of life in the long-term.        Medication Adjustments/Labs and Tests Ordered: Current medicines are reviewed at length with the patient today.  Concerns regarding medicines are outlined above.  Orders Placed This Encounter  Procedures  . Basic metabolic panel  . EKG 12-Lead   No orders of the defined types were placed in this encounter.   Patient Instructions  Medication Instructions:  No changes *If you need a refill on your cardiac medications before your next appointment, please call your pharmacy*   Lab Work: Your provider would like for  you to have the following labs today: BMET and Lipid  If you have labs (blood work) drawn today and your tests are completely normal, you will receive your results only by: Marland Kitchen MyChart Message (if you have MyChart) OR . A paper copy in the mail If you have any lab test that is abnormal or we need to change your treatment, we will call you to review the results.   Testing/Procedures: None ordered   Follow-Up: At [facility], you and your health needs are our priority.  As part of our continuing mission to provide you with exceptional heart care, we have created designated Provider Care Teams.  These Care Teams include your primary Cardiologist (physician) and Advanced Practice Providers (APPs -  Physician Assistants and Nurse Practitioners) who all work together to provide you with the care you need, when you need it.  We recommend signing up for the patient portal called "MyChart".  Sign up information is provided on this After Visit Summary.  MyChart is used to connect with patients for Virtual Visits (Telemedicine).  Patients are able to view lab/test results, encounter notes, upcoming appointments, etc.  Non-urgent messages can be sent to your provider as well.   To learn more about what you can do with MyChart, go to [email or web address].    Your next appointment:   6 month(s)  The format for your next appointment:   In Person  Provider:   You may see Sanda Klein, MD or one of the following Advanced Practice Providers on your designated Care Team:    Almyra Deforest, PA-C  Fabian Sharp, [place] or   Roby Lofts, [place]    Other Instructions Please use Acetaminophen (Tylenol) for joint aches and pains Please use NSAIDS (Meloxican) sparingly     Signed, Sanda Klein, MD  06/07/2020 9:33 AM    [place]

## 2020-06-05 NOTE — Patient Instructions (Signed)
Medication Instructions:  No changes *If you need a refill on your cardiac medications before your next appointment, please call your pharmacy*   Lab Work: Your provider would like for you to have the following labs today: BMET and Lipid  If you have labs (blood work) drawn today and your tests are completely normal, you will receive your results only by: Marland Kitchen MyChart Message (if you have MyChart) OR . A paper copy in the mail If you have any lab test that is abnormal or we need to change your treatment, we will call you to review the results.   Testing/Procedures: None ordered   Follow-Up: At [facility], you and your health needs are our priority.  As part of our continuing mission to provide you with exceptional heart care, we have created designated Provider Care Teams.  These Care Teams include your primary Cardiologist (physician) and Advanced Practice Providers (APPs -  Physician Assistants and Nurse Practitioners) who all work together to provide you with the care you need, when you need it.  We recommend signing up for the patient portal called "MyChart".  Sign up information is provided on this After Visit Summary.  MyChart is used to connect with patients for Virtual Visits (Telemedicine).  Patients are able to view lab/test results, encounter notes, upcoming appointments, etc.  Non-urgent messages can be sent to your provider as well.   To learn more about what you can do with MyChart, go to [email or web address].    Your next appointment:   6 month(s)  The format for your next appointment:   In Person  Provider:   You may see Sanda Klein, MD or one of the following Advanced Practice Providers on your designated Care Team:    Almyra Deforest, PA-C  Fabian Sharp, [place] or   Roby Lofts, [place]    Other Instructions Please use Acetaminophen (Tylenol) for joint aches and pains Please use NSAIDS (Meloxican) sparingly

## 2020-06-07 ENCOUNTER — Encounter: Payer: Self-pay | Admitting: Cardiovascular Disease

## 2020-06-27 ENCOUNTER — Other Ambulatory Visit: Payer: Self-pay

## 2020-06-27 ENCOUNTER — Ambulatory Visit (INDEPENDENT_AMBULATORY_CARE_PROVIDER_SITE_OTHER): Payer: 59 | Admitting: Podiatry

## 2020-06-27 ENCOUNTER — Encounter: Payer: Self-pay | Admitting: Podiatry

## 2020-06-27 DIAGNOSIS — M722 Plantar fascial fibromatosis: Secondary | ICD-10-CM | POA: Diagnosis not present

## 2020-06-27 MED ORDER — PREDNISONE 10 MG PO TABS: ORAL_TABLET | ORAL | 0 refills | Status: DC

## 2020-06-27 NOTE — Progress Notes (Signed)
Subjective:   Patient ID: George Maldonado, male   DOB: 55 y.o.   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  Also states that it is just not responded and is making work difficult and he knows he needs orthotics to try to hold up his arch   ROS      Objective:  Physical Exam  Neurovascular status intact with exquisite discomfort in the medial fascial band right at the insertion of the tendon into the calcaneus with inflammation fluid with flatfoot deformity noted     Assessment:  Acute plantar fasciitis right inflammation fluid medial band     Plan:  H&P reviewed all conditions for this.  Today I went ahead did sterile prep injected the plantar fascia 3 mg Dexasone Kenalog 5 mg liken at insertion I dispensed a night splint with ice packs with all instructions on usage and having him sleep in it at the current time and I casted for functional orthotic devices.  Patient will be seen back when orthotics are ready and is encouraged to call questions concerns prior to that date

## 2020-06-28 IMAGING — NM NM BONE 3 PHASE
10 series · 20 of 20 positions shown · non-contrast
Comparison: 01/31/2016

Radiographic correlation: None recent; CT abdomen and pelvis
01/01/2017

CLINICAL DATA: LEFT hip pain, history of RIGHT hip arthroplasty
9063 and LEFT hip arthroplasty 8055, nonradiating LEFT hip pain

EXAM:
NUCLEAR MEDICINE 3-PHASE BONE SCAN
TECHNIQUE: Radionuclide angiographic images, immediate static blood pool
images, and 3-hour delayed static images were obtained of the hips
after intravenous injection of radiopharmaceutical.
RADIOPHARMACEUTICALS:  19.5 mCi Wc-BBm MDP IV

[Series 1: flow · 2.07mm/px · 6 of 48 frames shown (1 of 2)]
[frame 5/48  full-range]
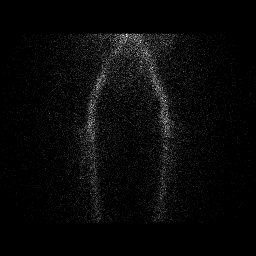
[frame 13/48  full-range]
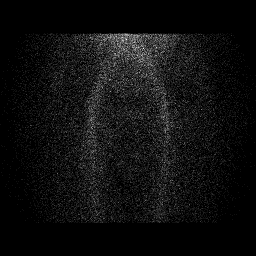
[frame 21/48  full-range]
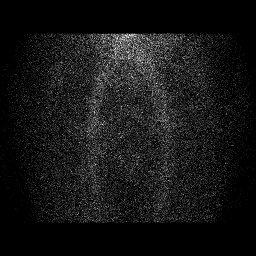
[frame 29/48  full-range]
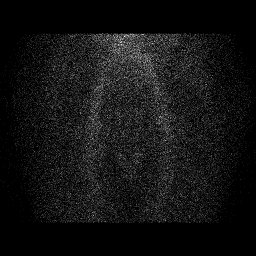
[frame 37/48  full-range]
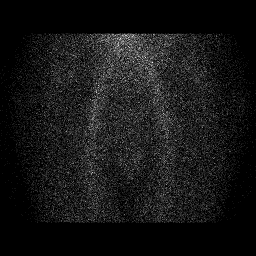
[frame 45/48  full-range]
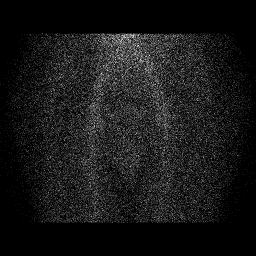

[Series 1: flow · 2.07mm/px · 6 of 48 frames shown (2 of 2)]
[frame 5/48]
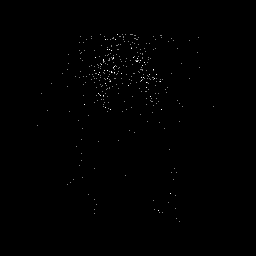
[frame 13/48  full-range]
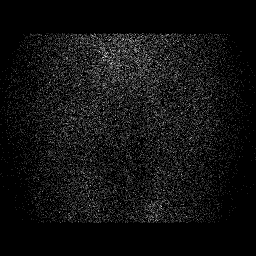
[frame 21/48  full-range]
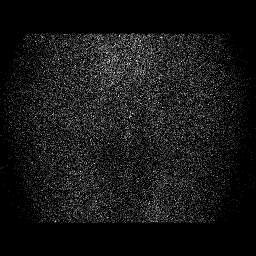
[frame 29/48  full-range]
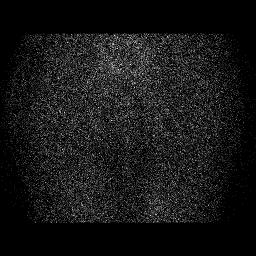
[frame 37/48  full-range]
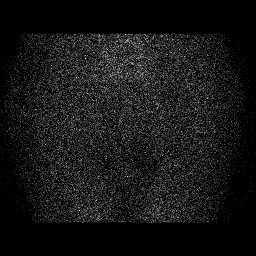
[frame 45/48  full-range]
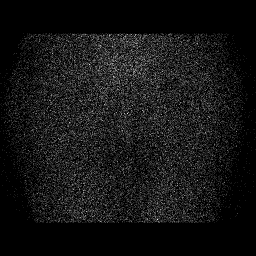

[Series 2: blood pool · 2.07mm/px · 1 of 1 slices shown (1 of 2)]
[im 1/1]
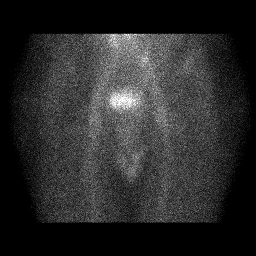

[Series 2: blood pool · 2.07mm/px · 1 of 1 slices shown (2 of 2)]
[im 1/1  full-range]
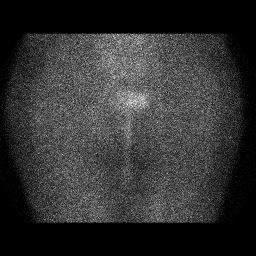

[Series 3: lat bp · 2.07mm/px · 1 of 1 slices shown (1 of 2)]
[im 1/1]
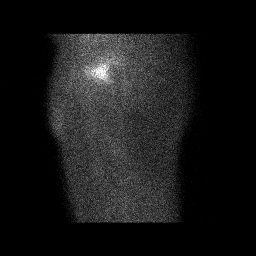

[Series 3: lat bp · 2.07mm/px · 1 of 1 slices shown (2 of 2)]
[im 1/1]
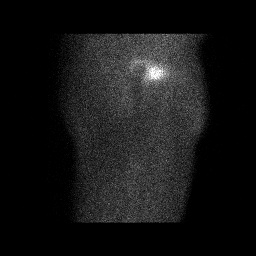

[Series 4: delay · delayed · 2.07mm/px · 1 of 1 slices shown (1 of 4)]
[im 1/1  full-range]
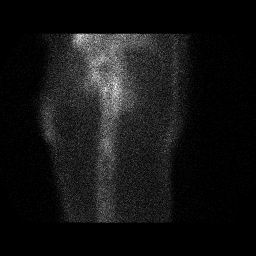

[Series 4: delay · delayed · 2.07mm/px · 1 of 1 slices shown (2 of 4)]
[im 1/1]
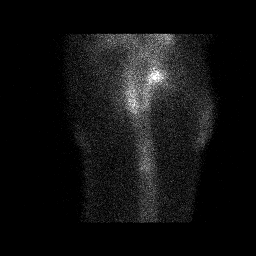

[Series 5: delay · delayed · 2.07mm/px · 1 of 1 slices shown (3 of 4)]
[im 1/1]
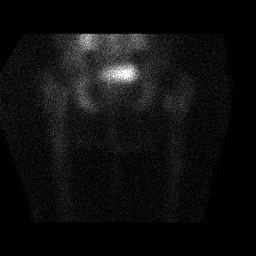

[Series 5: delay · delayed · 2.07mm/px · 1 of 1 slices shown (4 of 4)]
[im 1/1]
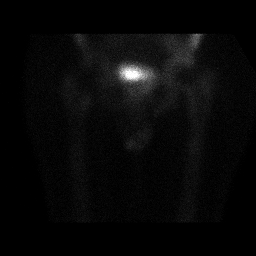

[20 of 20 positions shown; findings below may reference images not displayed]

FINDINGS: Vascular phase: Normal blood flow to both hips

Blood pool phase: Normal blood pool at both hips

Delayed phase: Abnormal tracer uptake again identified at the LEFT
SI joint and LEFT ischium. No abnormalities are seen at the sites on
CT. Asymmetric tracer uptake is seen at the anterior aspect of the
LEFT iliac bone, also without CT correlate, though the LEFT iliac
bone does extend more anterior than the RIGHT and the apparent
increased uptake at the anterior LEFT iliac bone could be due to
closer position to the camera and less soft tissue attenuation.
Additionally, uptake is identified adjacent to the proximal and
aspect and at the tip of the femoral component of the hip prostheses
bilaterally; this pattern suggests loosening especially on the
symptomatic LEFT side.
IMPRESSION: Chronic increased tracer uptake at the LEFT SI joint, LEFT ischium,
and anterior LEFT iliac crest without CT correlates.

Uptake adjacent to the proximal and distal aspects of the femoral
component of the LEFT hip prosthesis; in the setting of pain this is
suspicious for aseptic loosening.

Uptake is also identified adjacent to the proximal and distal
aspects of the femoral component of the RIGHT hip prosthesis though
patient is not reported to have pain at the RIGHT hip and
significance of this finding is uncertain.

## 2020-08-20 ENCOUNTER — Ambulatory Visit (INDEPENDENT_AMBULATORY_CARE_PROVIDER_SITE_OTHER): Payer: 59 | Admitting: *Deleted

## 2020-08-20 ENCOUNTER — Other Ambulatory Visit: Payer: Self-pay

## 2020-08-20 DIAGNOSIS — M722 Plantar fascial fibromatosis: Secondary | ICD-10-CM

## 2020-08-20 NOTE — Progress Notes (Signed)
Patient presents today to pick up custom molded foot orthotics, diagnosed with plantar fasciiis by Dr. Paulla Dolly.   Orthotics were dispensed and fit was satisfactory. Reviewed instructions for break-in and wear. Written instructions given to patient.  Patient will follow up as needed.   [facility] - order # [id number]

## 2020-10-10 ENCOUNTER — Other Ambulatory Visit: Payer: Self-pay

## 2020-10-10 ENCOUNTER — Other Ambulatory Visit: Payer: 59

## 2020-12-07 ENCOUNTER — Other Ambulatory Visit: Payer: Self-pay | Admitting: Cardiovascular Disease

## 2020-12-27 ENCOUNTER — Other Ambulatory Visit: Payer: Self-pay

## 2020-12-27 ENCOUNTER — Encounter: Payer: Self-pay | Admitting: Podiatry

## 2020-12-27 ENCOUNTER — Ambulatory Visit (INDEPENDENT_AMBULATORY_CARE_PROVIDER_SITE_OTHER): Payer: 59 | Admitting: Podiatry

## 2020-12-27 DIAGNOSIS — R600 Localized edema: Secondary | ICD-10-CM

## 2020-12-27 DIAGNOSIS — M722 Plantar fascial fibromatosis: Secondary | ICD-10-CM | POA: Diagnosis not present

## 2020-12-27 MED ORDER — TRIAMCINOLONE ACETONIDE 10 MG/ML IJ SUSP
10.0000 mg | Freq: Once | INTRAMUSCULAR | Status: AC
Start: 2020-12-27 — End: 2020-12-27
  Administered 2020-12-27: 10 mg

## 2020-12-27 NOTE — Progress Notes (Signed)
Subjective:   Patient ID: George Maldonado, male   DOB: 55 y.o.   MRN: [id number]   HPI Patient states he is developed swelling in his ankle over the last month and has a lot of discomfort still in his right heel even though it is improved from when we treated it previously but still can bother him.  States has not really had pain but the swelling but was concerned and wanted it checked.  Patient is moderately obese   ROS      Objective:  Physical Exam  Neurovascular status intact negative Bevelyn Buckles' sign noted right with swelling around the right ankle foot nonpitting in nature.  Patient has acute inflammation of the plantar fascial right at the insertional point of the tendon into the calcaneus     Assessment:  Cannot rule out any types of systemic pathology or it may be a localized process with swelling of his ankle with his negative Bevelyn Buckles' sign noted.  Moderate Planter fasciitis symptomatology     Plan:  H&P educated him on the ankle and the possibilities that this could be related to systemic pathology or blood clot.  I did apply ankle compression stocking I wanted to keep an eye on a daily basis if he should start to develop pain if it does not get better with elevation compression or if any other symptoms were to occur he is to go straight to the emergency room and he knows eventually he may need ultrasound or Doppler done.  For the heel I did do sterile prep injected the plantar fashion 3 mg Kenalog 5 mg Xylocaine

## 2021-01-23 ENCOUNTER — Other Ambulatory Visit: Payer: Self-pay

## 2021-01-23 ENCOUNTER — Other Ambulatory Visit: Payer: Self-pay | Admitting: Internal Medicine

## 2021-01-23 ENCOUNTER — Other Ambulatory Visit (HOSPITAL_COMMUNITY): Payer: Self-pay | Admitting: Internal Medicine

## 2021-01-23 ENCOUNTER — Ambulatory Visit (HOSPITAL_COMMUNITY)
Admission: RE | Admit: 2021-01-23 | Discharge: 2021-01-23 | Disposition: A | Discharge status: Home / Self Care | Payer: 59 | Source: Ambulatory Visit | Attending: Internal Medicine | Admitting: Internal Medicine

## 2021-01-23 DIAGNOSIS — M79604 Pain in right leg: Secondary | ICD-10-CM | POA: Diagnosis present

## 2021-01-27 ENCOUNTER — Other Ambulatory Visit: Payer: Self-pay | Admitting: Internal Medicine

## 2021-01-27 ENCOUNTER — Other Ambulatory Visit (HOSPITAL_COMMUNITY): Payer: Self-pay | Admitting: Internal Medicine

## 2021-01-27 DIAGNOSIS — M79604 Pain in right leg: Secondary | ICD-10-CM

## 2021-02-04 ENCOUNTER — Other Ambulatory Visit (HOSPITAL_COMMUNITY): Payer: Self-pay | Admitting: Internal Medicine

## 2021-02-04 ENCOUNTER — Other Ambulatory Visit: Payer: Self-pay | Admitting: Internal Medicine

## 2021-02-04 DIAGNOSIS — R109 Unspecified abdominal pain: Secondary | ICD-10-CM

## 2021-02-11 ENCOUNTER — Other Ambulatory Visit: Payer: Self-pay | Admitting: Orthopedic Surgery

## 2021-02-11 ENCOUNTER — Other Ambulatory Visit (HOSPITAL_COMMUNITY): Payer: Self-pay | Admitting: Orthopedic Surgery

## 2021-02-11 DIAGNOSIS — M25552 Pain in left hip: Secondary | ICD-10-CM

## 2021-02-14 ENCOUNTER — Ambulatory Visit (HOSPITAL_BASED_OUTPATIENT_CLINIC_OR_DEPARTMENT_OTHER)
Admission: RE | Admit: 2021-02-14 | Discharge: 2021-02-14 | Disposition: A | Discharge status: Home / Self Care | Payer: 59 | Source: Ambulatory Visit | Attending: Internal Medicine | Admitting: Internal Medicine

## 2021-02-14 ENCOUNTER — Other Ambulatory Visit: Payer: Self-pay

## 2021-02-14 DIAGNOSIS — R109 Unspecified abdominal pain: Secondary | ICD-10-CM | POA: Insufficient documentation

## 2021-02-14 LAB — POCT I-STAT CREATININE: Creatinine, Ser: 1 mg/dL (ref 0.61–1.24)

## 2021-02-14 MED ORDER — IOHEXOL 300 MG/ML  SOLN
100.0000 mL | Freq: Once | INTRAMUSCULAR | Status: AC | PRN
  Administered 2021-02-14: 100 mL via INTRAVENOUS

## 2021-03-25 ENCOUNTER — Other Ambulatory Visit: Payer: Self-pay | Admitting: Cardiovascular Disease

## 2021-03-25 DIAGNOSIS — I10 Essential (primary) hypertension: Secondary | ICD-10-CM

## 2021-04-30 ENCOUNTER — Other Ambulatory Visit: Payer: Self-pay | Admitting: Cardiovascular Disease

## 2021-04-30 DIAGNOSIS — I10 Essential (primary) hypertension: Secondary | ICD-10-CM

## 2021-06-13 ENCOUNTER — Encounter: Payer: Self-pay | Admitting: Podiatry

## 2021-06-13 ENCOUNTER — Other Ambulatory Visit: Payer: Self-pay

## 2021-06-13 ENCOUNTER — Ambulatory Visit (INDEPENDENT_AMBULATORY_CARE_PROVIDER_SITE_OTHER): Payer: 59 | Admitting: Podiatry

## 2021-06-13 ENCOUNTER — Ambulatory Visit (INDEPENDENT_AMBULATORY_CARE_PROVIDER_SITE_OTHER): Payer: 59

## 2021-06-13 DIAGNOSIS — M779 Enthesopathy, unspecified: Secondary | ICD-10-CM | POA: Diagnosis not present

## 2021-06-13 DIAGNOSIS — M7752 Other enthesopathy of left foot: Secondary | ICD-10-CM

## 2021-06-13 DIAGNOSIS — D3610 Benign neoplasm of peripheral nerves and autonomic nervous system, unspecified: Secondary | ICD-10-CM

## 2021-06-13 MED ORDER — TRIAMCINOLONE ACETONIDE 10 MG/ML IJ SUSP
10.0000 mg | Freq: Once | INTRAMUSCULAR | Status: AC
  Administered 2021-06-13: 10 mg

## 2021-06-15 NOTE — Progress Notes (Signed)
Subjective:  ? ?Patient ID: George Maldonado, male   DOB: 56 y.o.   MRN: [id number]  ? 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 ? ?ROS ? ? ?   ?Objective:  ?Physical Exam  ?Neurovascular status intact with inflammation of the second third metatarsal phalangeal joint left with fluid buildup around the joint surface ? ?   ?Assessment:  ?Mobility for inflammatory capsulitis mostly around the second metatarsal phalangeal joint left foot ? ?   ?Plan:  ?Reviewed condition and x-ray sterile forefoot prep done anesthetized 60 mg like Marcaine mixture aspirated the second MPJ getting out a small amount of clear fluid injected quarter cc dexamethasone Kenalog along with into the third MPJ applied thick plantar past and and discussed possible orthotic changes.  Reappoint to recheck and advised on rigid bottom shoes ? ?X-rays indicate that there is no signs of fracture or other bone pathology associated with condition ?   ? ? ?

## 2021-06-19 ENCOUNTER — Other Ambulatory Visit: Payer: Self-pay | Admitting: Podiatry

## 2021-06-19 DIAGNOSIS — M779 Enthesopathy, unspecified: Secondary | ICD-10-CM

## 2021-07-04 ENCOUNTER — Ambulatory Visit (INDEPENDENT_AMBULATORY_CARE_PROVIDER_SITE_OTHER): Payer: 59 | Admitting: Podiatry

## 2021-07-04 DIAGNOSIS — M722 Plantar fascial fibromatosis: Secondary | ICD-10-CM

## 2021-07-04 DIAGNOSIS — M7752 Other enthesopathy of left foot: Secondary | ICD-10-CM | POA: Diagnosis not present

## 2021-07-04 MED ORDER — DICLOFENAC SODIUM 75 MG PO TBEC: 75.0000 mg | DELAYED_RELEASE_TABLET | Freq: Two times a day (BID) | ORAL | 2 refills | Status: DC

## 2021-07-04 NOTE — Progress Notes (Signed)
Subjective:  ? ?Patient ID: George Maldonado, male   DOB: 56 y.o.   MRN: [id number]  ? 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 ? ?ROS ? ? ?   ?Objective:  ?Physical Exam  ?Neurovascular status intact continued inflammation in the plantar MPJs left with improvement but pain still present with digital deformities and stress against the MPJs with patient working on cement surfaces and very tall large individual ? ?   ?Assessment:  ?Inflammatory capsulitis chronic left along with history of [place] fasciitis right ? ?   ?Plan:  ?H&P reviewed condition.  I do think that an orthotic that would offload the lesser MPJs would be of benefit to him and I discussed a more substantial orthotic.  We will get approval for this and I want him to start oral anti-inflammatory placed him on diclofenac and we discussed the importance of rigid bottom shoes stretching exercises ice as needed and padding.  Reappoint when we get approval for orthotic therapy for this patient signed this ?   ? ? ?

## 2021-07-15 ENCOUNTER — Other Ambulatory Visit: Payer: Self-pay | Admitting: Cardiovascular Disease

## 2021-07-15 DIAGNOSIS — I10 Essential (primary) hypertension: Secondary | ICD-10-CM

## 2021-07-23 ENCOUNTER — Other Ambulatory Visit: Payer: Self-pay | Admitting: Cardiovascular Disease

## 2021-09-15 ENCOUNTER — Other Ambulatory Visit: Payer: Self-pay | Admitting: Podiatry

## 2021-09-15 NOTE — Telephone Encounter (Signed)
He should be seen if still having pain

## 2021-09-15 NOTE — Telephone Encounter (Signed)
Please advise 

## 2021-11-01 ENCOUNTER — Other Ambulatory Visit: Payer: Self-pay | Admitting: Cardiovascular Disease

## 2021-11-01 DIAGNOSIS — I10 Essential (primary) hypertension: Secondary | ICD-10-CM

## 2021-12-05 ENCOUNTER — Other Ambulatory Visit: Payer: Self-pay | Admitting: Cardiovascular Disease

## 2021-12-06 ENCOUNTER — Other Ambulatory Visit: Payer: Self-pay | Admitting: Cardiovascular Disease

## 2021-12-06 DIAGNOSIS — I10 Essential (primary) hypertension: Secondary | ICD-10-CM

## 2022-01-03 ENCOUNTER — Other Ambulatory Visit: Payer: Self-pay | Admitting: Cardiovascular Disease

## 2022-01-03 DIAGNOSIS — I10 Essential (primary) hypertension: Secondary | ICD-10-CM

## 2022-02-02 ENCOUNTER — Other Ambulatory Visit: Payer: Self-pay | Admitting: Cardiovascular Disease

## 2022-02-02 DIAGNOSIS — I10 Essential (primary) hypertension: Secondary | ICD-10-CM

## 2022-02-09 ENCOUNTER — Other Ambulatory Visit (HOSPITAL_COMMUNITY): Payer: Self-pay | Admitting: Orthopedic Surgery

## 2022-02-09 DIAGNOSIS — Z96642 Presence of left artificial hip joint: Secondary | ICD-10-CM

## 2022-02-09 DIAGNOSIS — Z96641 Presence of right artificial hip joint: Secondary | ICD-10-CM

## 2022-02-09 DIAGNOSIS — Z96643 Presence of artificial hip joint, bilateral: Secondary | ICD-10-CM

## 2022-02-19 ENCOUNTER — Other Ambulatory Visit: Payer: Self-pay | Admitting: Cardiovascular Disease

## 2022-03-04 ENCOUNTER — Encounter (HOSPITAL_COMMUNITY)
Admission: RE | Admit: 2022-03-04 | Discharge: 2022-03-04 | Disposition: A | Discharge status: Home / Self Care | Payer: 59 | Source: Ambulatory Visit | Attending: Orthopedic Surgery | Admitting: Orthopedic Surgery

## 2022-03-04 DIAGNOSIS — Z96641 Presence of right artificial hip joint: Secondary | ICD-10-CM | POA: Insufficient documentation

## 2022-03-04 DIAGNOSIS — Z96643 Presence of artificial hip joint, bilateral: Secondary | ICD-10-CM | POA: Diagnosis not present

## 2022-03-04 DIAGNOSIS — Z96642 Presence of left artificial hip joint: Secondary | ICD-10-CM | POA: Diagnosis present

## 2022-03-04 MED ORDER — TECHNETIUM TC 99M MEDRONATE IV KIT
20.0000 | PACK | Freq: Once | INTRAVENOUS | Status: AC | PRN
  Administered 2022-03-04: 21.5 via INTRAVENOUS

## 2022-03-05 ENCOUNTER — Other Ambulatory Visit: Payer: Self-pay | Admitting: Cardiovascular Disease

## 2022-03-08 ENCOUNTER — Other Ambulatory Visit: Payer: Self-pay | Admitting: Cardiovascular Disease

## 2022-03-08 DIAGNOSIS — I10 Essential (primary) hypertension: Secondary | ICD-10-CM

## 2022-03-09 ENCOUNTER — Ambulatory Visit (INDEPENDENT_AMBULATORY_CARE_PROVIDER_SITE_OTHER): Payer: 59 | Admitting: Urology

## 2022-03-09 DIAGNOSIS — N281 Cyst of kidney, acquired: Secondary | ICD-10-CM | POA: Diagnosis not present

## 2022-03-09 DIAGNOSIS — R350 Frequency of micturition: Secondary | ICD-10-CM

## 2022-03-09 DIAGNOSIS — N401 Enlarged prostate with lower urinary tract symptoms: Secondary | ICD-10-CM | POA: Diagnosis not present

## 2022-03-09 LAB — URINALYSIS, ROUTINE W REFLEX MICROSCOPIC
Bilirubin, UA: NEGATIVE
Glucose, UA: NEGATIVE
Ketones, UA: NEGATIVE
Leukocytes,UA: NEGATIVE
Nitrite, UA: NEGATIVE
Protein,UA: NEGATIVE
RBC, UA: NEGATIVE
Specific Gravity, UA: 1.02 (ref 1.005–1.030)
Urobilinogen, Ur: 0.2 mg/dL (ref 0.2–1.0)
pH, UA: 5.5 (ref 5.0–7.5)

## 2022-03-09 NOTE — Progress Notes (Unsigned)
03/09/2022 9:35 AM   George Maldonado June 02, 1965 [id number]  Referring provider: Redmond School, MD [address] [place],  [place] [address]  No chief complaint on file.   HPI:  New pt -   1) BPH - AUASS = 5. CT in 2022 with about a 25 g prostate. Occasional frequency. Started prostagenix. Improved. No prior prostate meds. No surgery.  His PSA at [facility] was 0.9 in Oct 2023.    A cousin had PCa.   2) left renal cysts - seen on CT scan Nov 2022 with two left renal cysts. He has had right flank pain randomly since 2022. Nagging dull ache. Worse after eating and drinking. No gross hematuria.   Today, seen for the above.   UA normal.   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   PMH: Past Medical History:  Diagnosis Date   Arthritis    GERD (gastroesophageal reflux disease)    occasionally   Gout    Heart murmur    as a child   Hypertension     Surgical History: Past Surgical History:  Procedure Laterality Date   COLONOSCOPY N/A 02/10/2018   Procedure: COLONOSCOPY;  Surgeon: Rogene Houston, MD;  [facility];  Service: Endoscopy;  Laterality: N/A;  730   NO PAST SURGERIES     TOTAL HIP ARTHROPLASTY Right 08/01/2012   Procedure: TOTAL HIP ARTHROPLASTY;  Surgeon: Kerin Salen, MD;  Location: [place];  Service: Orthopedics;  Laterality: Right;  DEPUY, NEEDS LONG TABLE PATIENT 6'8" 290 LBS   TOTAL HIP ARTHROPLASTY Left 11/02/2016   Procedure: LEFT TOTAL HIP ARTHROPLASTY ANTERIOR APPROACH;  Surgeon: Frederik Pear, MD;  Location: [place];  Service: Orthopedics;  Laterality: Left;    Home Medications:  Allergies as of 03/09/2022   No Known Allergies      Medication List        Accurate as of March 09, 2022  9:35 AM. If you have any questions, ask your nurse or doctor.          allopurinol 300 MG tablet Commonly known as: ZYLOPRIM Take 300 mg by mouth every evening. (1900)   amLODipine 10 MG tablet Commonly known as: NORVASC TAKE 1 TABLET BY  MOUTH EVERY DAY   atorvastatin 40 MG tablet Commonly known as: LIPITOR TAKE 1 TABLET (40 MG TOTAL) BY MOUTH DAILY. SCHEDULE OFFICE VISIT FOR FUTURE REFILLS.   benazepril 40 MG tablet Commonly known as: LOTENSIN TAKE 1 TABLET BY MOUTH EVERY DAY   ciprofloxacin 500 MG tablet Commonly known as: CIPRO Take 500 mg by mouth 2 (two) times daily.   diclofenac 75 MG EC tablet Commonly known as: VOLTAREN Take 1 tablet (75 mg total) by mouth 2 (two) times daily.   diclofenac 75 MG EC tablet Commonly known as: VOLTAREN TAKE 1 TABLET BY MOUTH TWICE A DAY   famotidine 20 MG tablet Commonly known as: PEPCID Take 20 mg by mouth 2 (two) times daily.   indomethacin 50 MG capsule Commonly known as: INDOCIN Take 50 mg by mouth 3 (three) times daily.   montelukast 10 MG tablet Commonly known as: SINGULAIR Take 10 mg by mouth daily as needed.   predniSONE 10 MG tablet Commonly known as: DELTASONE 12 day tapering dose   zolpidem 5 MG tablet Commonly known as: AMBIEN Take 5 mg by mouth at bedtime as needed.        Allergies: No Known Allergies  Family History: Family History  Problem Relation Age of  Onset   Heart disease Father    Kidney disease Brother     Social History:  reports that he has never smoked. He has never used smokeless tobacco. He reports that he does not drink alcohol and does not use drugs.   Physical Exam: There were no vitals taken for this visit.  Constitutional:  Alert and oriented, No acute distress. HEENT: [place] AT, moist mucus membranes.  Trachea midline, no masses. Cardiovascular: No clubbing, cyanosis, or edema. Respiratory: Normal respiratory effort, no increased work of breathing. GI: Abdomen is soft, nontender, nondistended, no abdominal masses GU: No CVA tenderness Lymph: No cervical or inguinal lymphadenopathy. Skin: No rashes, bruises or suspicious lesions. Neurologic: Grossly intact, no focal deficits, moving all 4 extremities. Psychiatric:  Normal mood and affect. GU: Penis circumcised, normal foreskin, testicles descended bilaterally and palpably normal, bilateral epididymis palpably normal, scrotum normal DRE: Prostate 25 g, smooth without hard area or nodule   Laboratory Data: Lab Results  Component Value Date   WBC 10.6 (H) 01/01/2017   HGB 13.7 01/01/2017   HCT 41.3 01/01/2017   MCV 84.5 01/01/2017   PLT 236 01/01/2017    Lab Results  Component Value Date   CREATININE 1.00 02/14/2021    No results found for: "PSA" PSA reviewed from [place]  No results found for: "TESTOSTERONE"  No results found for: "HGBA1C"  Urinalysis    Component Value Date/Time   COLORURINE YELLOW 01/01/2017 [place] 01/01/2017 0653   LABSPEC 1.018 01/01/2017 0653   PHURINE 5.0 01/01/2017 0653   GLUCOSEU NEGATIVE 01/01/2017 0653   HGBUR MODERATE (A) 01/01/2017 [place] 01/01/2017 [place] 01/01/2017 0653   PROTEINUR NEGATIVE 01/01/2017 0653   UROBILINOGEN 0.2 07/25/2012 0906   NITRITE NEGATIVE 01/01/2017 0653   LEUKOCYTESUR NEGATIVE 01/01/2017 0653    Lab Results  Component Value Date   BACTERIA NONE SEEN 01/01/2017    Pertinent Imaging: Ct images reviewed --   Results for orders placed during the hospital encounter of 01/01/17  CT Renal Stone Study  Narrative CLINICAL DATA:  Left side abdominal pain, left flank pain  EXAM: CT ABDOMEN AND PELVIS WITHOUT CONTRAST  TECHNIQUE: Multidetector CT imaging of the abdomen and pelvis was performed following the standard protocol without IV contrast.  COMPARISON:  None.  FINDINGS: Lower chest: Lung bases are clear. No effusions. Heart is normal size.  Hepatobiliary: No focal hepatic abnormality. Gallbladder unremarkable.  Pancreas: No focal abnormality or ductal dilatation.  Spleen: No focal abnormality.  Normal size.  Adrenals/Urinary Tract: There is moderate left hydronephrosis and perinephric stranding.  Left ureter is dilated into the pelvis but is obscured by beam hardening artifact from bilateral hip replacements. I suspect there is a distal left ureteral stone although this cannot be visualized with the artifact. No hydronephrosis or stones on the right. Adrenal glands are unremarkable. Low-density lesions within the upper and midpole of the left kidney likely reflects cysts.  Stomach/Bowel: Appendix is normal. Stomach, large and small bowel grossly unremarkable.  Vascular/Lymphatic: No evidence of aneurysm or adenopathy.  Reproductive: No visible focal abnormality.  Other: No free fluid or free air.  Musculoskeletal: No acute bony abnormality. Degenerative changes in the lumbar spine. Bilateral hip replacements noted.  IMPRESSION: Left hydronephrosis and perinephric stranding. The left ureter is dilated into the lower pelvis but thin is obscured from beam hardening artifact from bilateral hip replacements. Cannot visualize the distal ureter, but suspect distal left ureteral stone.  Electronically Signed By: Rolm Baptise M.D. On: 01/01/2017 09:09   Assessment & Plan:    1. Benign prostatic hyperplasia, unspecified whether lower urinary tract symptoms present Disc nature of bph - nature r/b of surveillance, meds and procedures. He reports recent PSA but I did see it in the referral notes. Will call.   - Urinalysis, Routine w reflex microscopic  2> renal cysts - disc left cysts are benign. No explanation for right flank pain from GU pt of view. Check renal US in a few months.   No follow-ups on file.  Festus Aloe, MD  [facility]  [address] [place], [place] [address] [phone]

## 2022-04-10 ENCOUNTER — Other Ambulatory Visit: Payer: Self-pay | Admitting: Cardiovascular Disease

## 2022-04-14 ENCOUNTER — Other Ambulatory Visit: Payer: Self-pay | Admitting: Cardiovascular Disease

## 2022-04-21 ENCOUNTER — Other Ambulatory Visit: Payer: Self-pay | Admitting: Cardiovascular Disease

## 2022-04-22 NOTE — Telephone Encounter (Signed)
Attempted to contact patient, he is overdue for an appointment.   Advised him to call back to get scheduled and I would refill for the amount of time to his follow up visit.   Left call back number.

## 2022-04-24 ENCOUNTER — Other Ambulatory Visit: Payer: Self-pay | Admitting: Cardiovascular Disease

## 2022-04-24 NOTE — Telephone Encounter (Signed)
NEED OV.

## 2022-04-27 MED ORDER — ATORVASTATIN CALCIUM 40 MG PO TABS: 40.0000 mg | ORAL_TABLET | Freq: Every day | ORAL | 1 refills | Status: DC

## 2022-05-01 IMAGING — US US EXTREM LOW VENOUS*R*
1 series · 13 of 24 positions shown · non-contrast
Comparison: None.

CLINICAL DATA: Right lower extremity pain and edema the past month.
History of gouty arthritis. Evaluate for DVT.



[Series 1: us venous img lower uni right (dvt) · portal-venous · 13 of 49 slices shown]
[im 1/49]
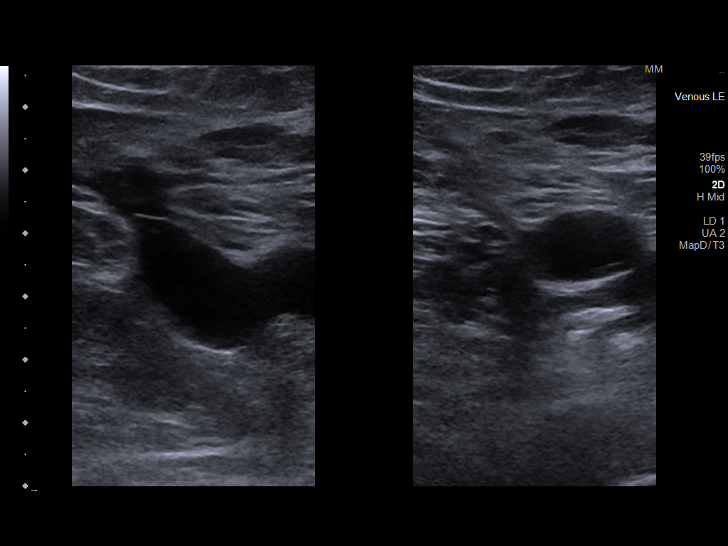
[im 5/49]
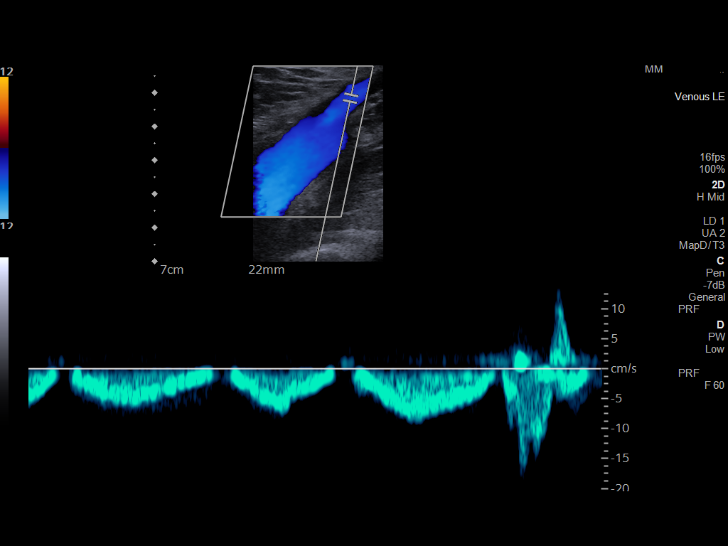
[im 9/49]
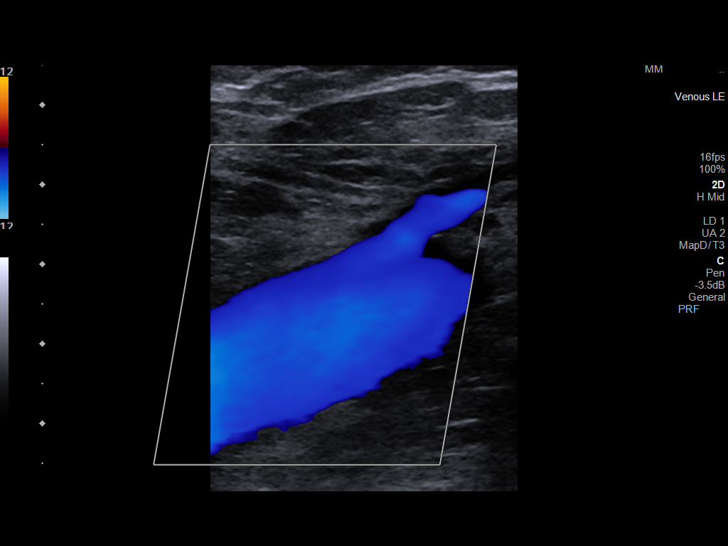
[im 13/49]
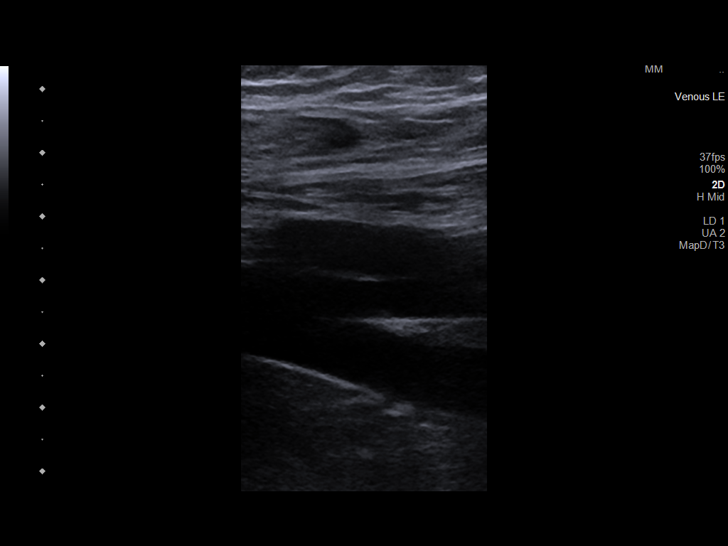
[im 17/49]
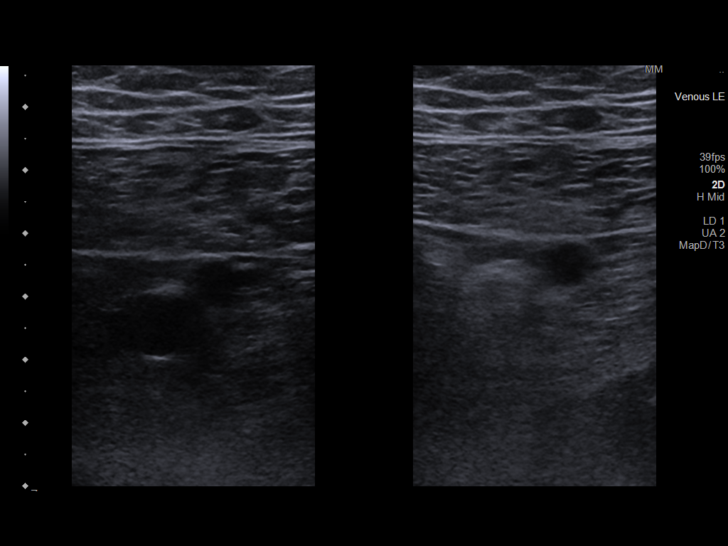
[im 21/49]
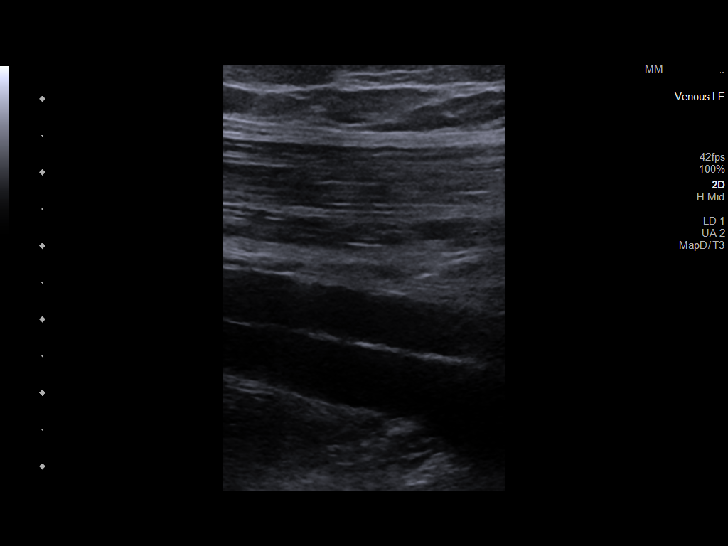
[im 26/49]
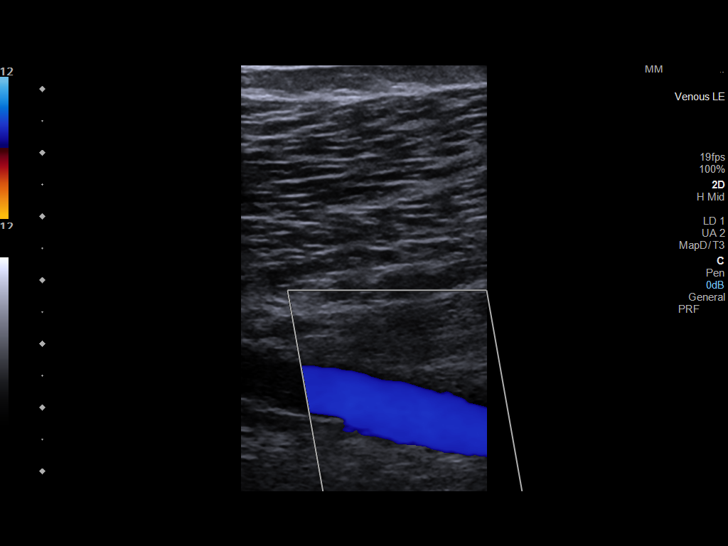
[im 28/49]
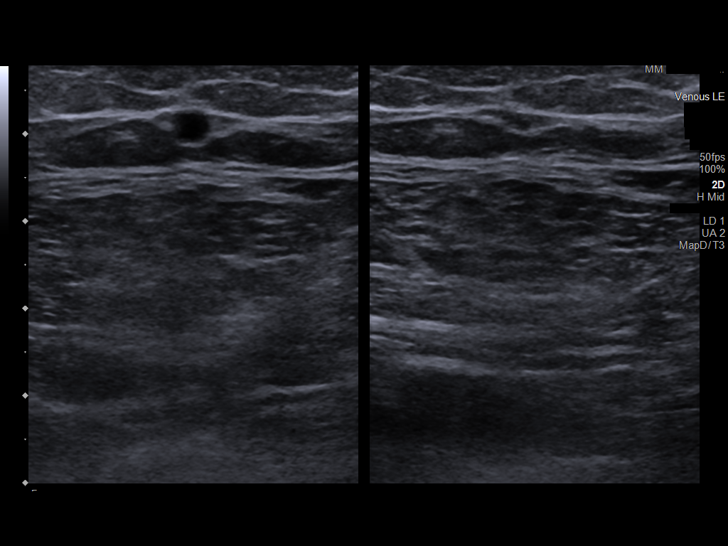
[im 32/49]
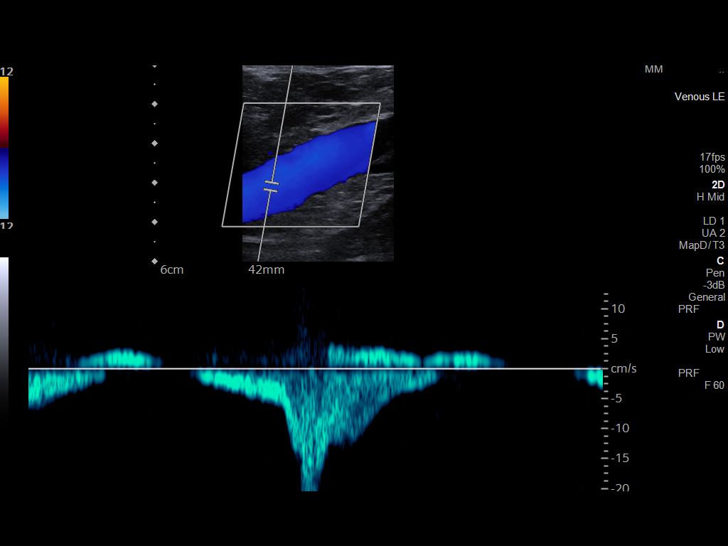
[im 36/49]
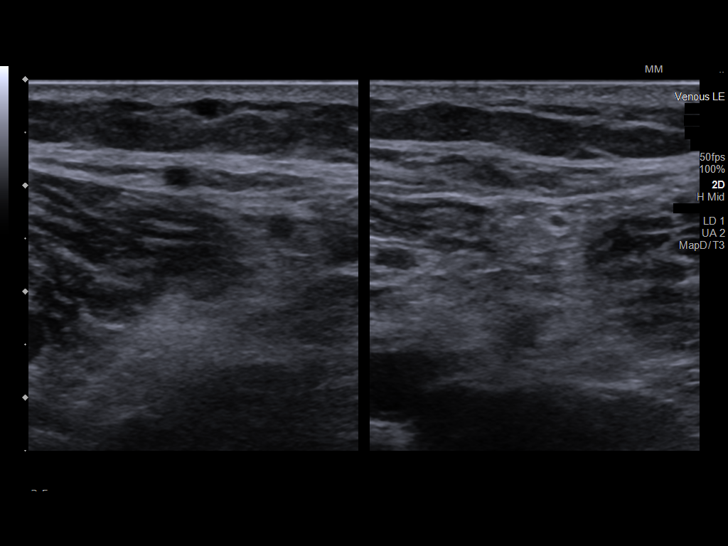
[im 40/49]
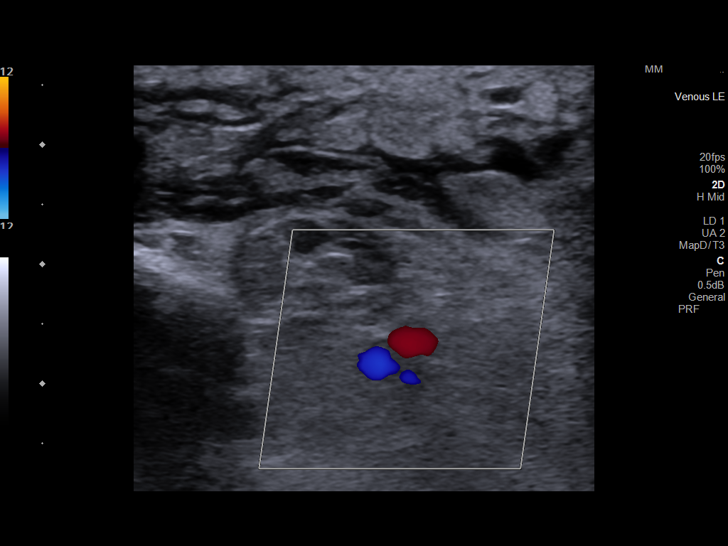
[im 44/49]
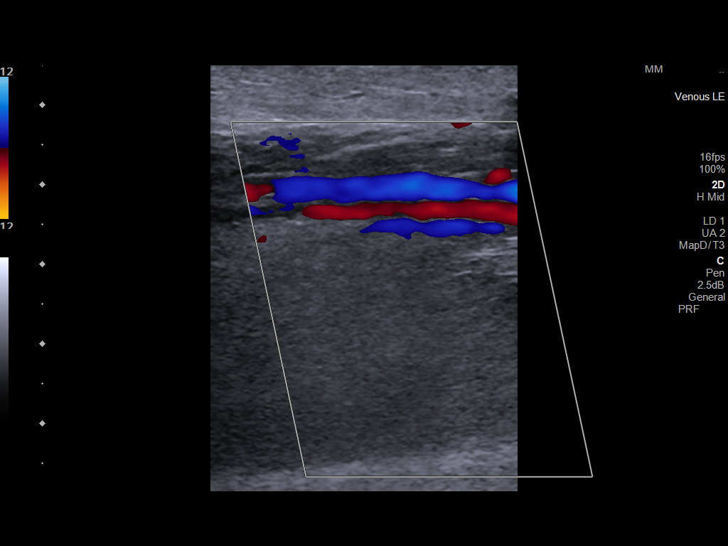
[im 49/49]
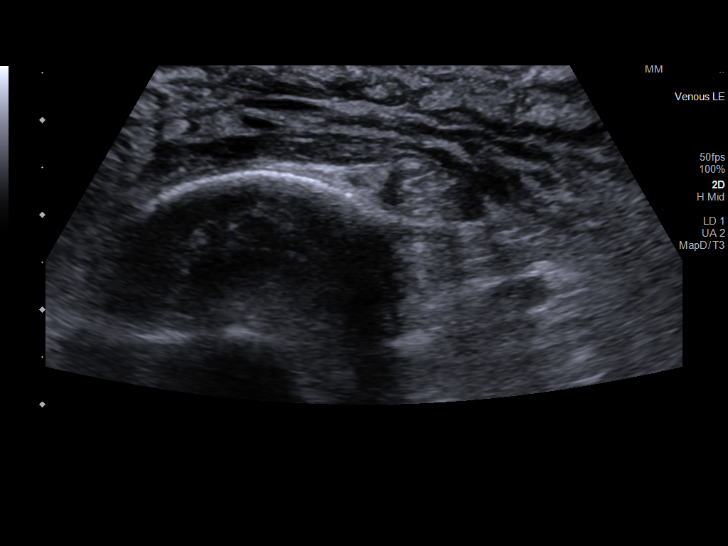

[13 of 24 positions shown; findings below may reference images not displayed]

FINDINGS: Contralateral Common Femoral Vein: Respiratory phasicity is normal
and symmetric with the symptomatic side. No evidence of thrombus.
Normal compressibility.

Common Femoral Vein: No evidence of thrombus. Normal
compressibility, respiratory phasicity and response to augmentation.

Saphenofemoral Junction: No evidence of thrombus. Normal
compressibility and flow on color Doppler imaging.

Profunda Femoral Vein: No evidence of thrombus. Normal
compressibility and flow on color Doppler imaging.

Femoral Vein: No evidence of thrombus. Normal compressibility,
respiratory phasicity and response to augmentation.

Popliteal Vein: No evidence of thrombus. Normal compressibility,
respiratory phasicity and response to augmentation.

Calf Veins: No evidence of thrombus. Normal compressibility and flow
on color Doppler imaging.

Superficial Great Saphenous Vein: No evidence of thrombus. Normal
compressibility.

Venous Reflux:  None.

Other Findings: There is a moderate amount of subcutaneous edema at
the level of the left lower leg and calf (images 47 through 50).
IMPRESSION: No evidence of DVT within the right lower extremity.

## 2022-05-16 ENCOUNTER — Other Ambulatory Visit: Payer: Self-pay | Admitting: Cardiovascular Disease

## 2022-05-16 ENCOUNTER — Other Ambulatory Visit: Payer: Self-pay | Admitting: Podiatry

## 2022-05-16 DIAGNOSIS — I10 Essential (primary) hypertension: Secondary | ICD-10-CM

## 2022-05-20 NOTE — Progress Notes (Signed)
Cardiology Clinic Note   Patient Name: George Maldonado Date of Encounter: 05/22/2022  Primary Care Provider:  Sharilyn Sites, MD Primary Cardiologist:  Sanda Klein, MD  Patient Profile    George Maldonado 57 year old male presents to the clinic today for follow-up evaluation of his HTN and HLD.  Past Medical History    Past Medical History:  Diagnosis Date   Arthritis    GERD (gastroesophageal reflux disease)    occasionally   Gout    Heart murmur    as a child   Hypertension    Past Surgical History:  Procedure Laterality Date   COLONOSCOPY N/A 02/10/2018   Procedure: COLONOSCOPY;  Surgeon: Rogene Houston, MD;  [facility];  Service: Endoscopy;  Laterality: N/A;  730   NO PAST SURGERIES     TOTAL HIP ARTHROPLASTY Right 08/01/2012   Procedure: TOTAL HIP ARTHROPLASTY;  Surgeon: Kerin Salen, MD;  Location: [place];  Service: Orthopedics;  Laterality: Right;  DEPUY, NEEDS LONG TABLE PATIENT 6'8" 290 LBS   TOTAL HIP ARTHROPLASTY Left 11/02/2016   Procedure: LEFT TOTAL HIP ARTHROPLASTY ANTERIOR APPROACH;  Surgeon: Frederik Pear, MD;  Location: [place];  Service: Orthopedics;  Laterality: Left;    Allergies  No Known Allergies  History of Present Illness    George Maldonado has a PMH of HTN, hyperlipidemia, gout, syncopal episodes, orthostatic hypotension, obesity and hyperuricemia.  He is a former basketball and Psychologist, educational.  His PMH also includes bilateral hip arthroplasties and hip pain.  He was noted to have several near syncopal episodes with symptoms of orthostatic hypotension that led to hospitalization in September, 21.  During that time he was taking a thiazide diuretic.  His medications were adjusted to avoid diuretics.  After medication adjustment he did not have any complaints of near syncope or dizziness.  He was seen by Dr. Sallyanne Kuster 06/05/2020.  During that time he complained of musculoskeletal aches and pains particularly in  his back.  He was taking NSAID therapy.  His blood pressure was borderline high in the 130s-140s and he was noted to be bradycardic with heart rate in the 50s.  He was able to work hard without chest discomfort, shortness of breath, palpitations, and neurological deficits.  He did note mild intermittent pretibial edema after being prescribed amlodipine.  He denied ED.  He presents to the clinic today for follow-up evaluation and states he feels well.  He presents to the clinic today for follow-up evaluation and to have his medication refilled.  He continues to work nights for [employer].  He reports that he walks on concrete throughout his shift and notices slight lower extremity swelling at times.  We reviewed the importance of low-sodium diet.  His blood pressure is controlled and is noted to be 136/82 today.  His EKG shows sinus bradycardia rightward axis deviation 50 bpm with no ectopy.  I will give him the salty 6 diet sheet, [place] support stocking she, refill his cardiac medications and plan follow-up for 12 months.  Today he denies chest pain, shortness of breath, lower extremity edema, fatigue, palpitations, melena, hematuria, hemoptysis, diaphoresis, weakness, presyncope, syncope, orthopnea, and PND.   Home Medications    Prior to Admission medications   Medication Sig Start Date End Date Taking? Authorizing Provider  allopurinol (ZYLOPRIM) 300 MG tablet Take 300 mg by mouth every evening. (1900)    [provider]  amLODipine (NORVASC) 10 MG tablet TAKE 1 TABLET BY MOUTH  EVERY DAY 03/09/22   Croitoru, Dani Gobble, MD  atorvastatin (LIPITOR) 40 MG tablet Take 1 tablet (40 mg total) by mouth daily. Schedule office visit for future refills. 04/27/22   Croitoru, Mihai, MD  benazepril (LOTENSIN) 40 MG tablet TAKE 1 TABLET BY MOUTH EVERY DAY 05/18/22   Croitoru, Mihai, MD  ciprofloxacin (CIPRO) 500 MG tablet Take 500 mg by mouth 2 (two) times daily. 08/29/20   [provider]  diclofenac  (VOLTAREN) 75 MG EC tablet Take 1 tablet (75 mg total) by mouth 2 (two) times daily. 01/05/20   Wallene Huh, DPM  diclofenac (VOLTAREN) 75 MG EC tablet TAKE 1 TABLET BY MOUTH TWICE A DAY 10/10/21   Wallene Huh, DPM  famotidine (PEPCID) 20 MG tablet Take 20 mg by mouth 2 (two) times daily. 11/25/20   [provider]  indomethacin (INDOCIN) 50 MG capsule Take 50 mg by mouth 3 (three) times daily. 08/30/20   [provider]  montelukast (SINGULAIR) 10 MG tablet Take 10 mg by mouth daily as needed. 10/28/20   [provider]  predniSONE (DELTASONE) 10 MG tablet 12 day tapering dose 06/27/20   Wallene Huh, DPM  zolpidem (AMBIEN) 5 MG tablet Take 5 mg by mouth at bedtime as needed. 12/21/20   [provider]    Family History    Family History  Problem Relation Age of Onset   Heart disease Father    Kidney disease Brother    He indicated that the status of his father is unknown. He indicated that the status of his brother is unknown.  Social History    Social History   Socioeconomic History   Marital status: Married    Spouse name: Not on file   Number of children: Not on file   Years of education: Not on file   Highest education level: Not on file  Occupational History   Not on file  Tobacco Use   Smoking status: Never   Smokeless tobacco: Never  Substance and Sexual Activity   Alcohol use: No   Drug use: No   Sexual activity: Not on file  Other Topics Concern   Not on file  Social History Narrative   Not on file   Social Determinants of Health   Financial Resource Strain: Not on file  Food Insecurity: Not on file  Transportation Needs: Not on file  Physical Activity: Not on file  Stress: Not on file  Social Connections: Not on file  Intimate Partner Violence: Not on file     Review of Systems    General:  No chills, fever, night sweats or weight changes.  Cardiovascular:  No chest pain, dyspnea on exertion, edema, orthopnea,  palpitations, paroxysmal nocturnal dyspnea. Dermatological: No rash, lesions/masses Respiratory: No cough, dyspnea Urologic: No hematuria, dysuria Abdominal:   No nausea, vomiting, diarrhea, bright red blood per rectum, melena, or hematemesis Neurologic:  No visual changes, wkns, changes in mental status. All other systems reviewed and are otherwise negative except as noted above.  Physical Exam    VS:  BP 136/82   Pulse (!) 50   Ht 6' 7"$  (2.007 m)   Wt (!) 319 lb 9.6 oz (145 kg)   BMI 36.00 kg/m  , BMI Body mass index is 36 kg/m. GEN: Well nourished, well developed, in no acute distress. HEENT: normal. Neck: Supple, no JVD, carotid bruits, or masses. Cardiac: RRR, no murmurs, rubs, or gallops. No clubbing, cyanosis, edema.  Radials/DP/PT 2+ and equal  bilaterally.  Respiratory:  Respirations regular and unlabored, clear to auscultation bilaterally. GI: Soft, nontender, nondistended, BS + x 4. MS: no deformity or atrophy. Skin: warm and dry, no rash. Neuro:  Strength and sensation are intact. Psych: Normal affect.  Accessory Clinical Findings    Recent Labs: No results found for requested labs within last 365 days.   Recent Lipid Panel    Component Value Date/Time   CHOL 172 06/05/2020 1002   TRIG 76 06/05/2020 1002   HDL 55 06/05/2020 1002   CHOLHDL 3.1 06/05/2020 1002   LDLCALC 103 (H) 06/05/2020 1002         ECG personally reviewed by me today-sinus bradycardia rightward axis deviation 50 bpm no ectopy- No acute changes  EKG 06/06/2020  Sinus bradycardia 57 bpm no ectopy  Assessment & Plan   1.  Essential hypertension-BP today 136/82 Continue amlodipine, Benzepril Heart healthy low-sodium diet-salty 6 given Increase physical activity as tolerated  Hyperlipidemia-LDL 79 on 01/28/21 Continue atorvastatin Heart healthy low-sodium high-fiber diet Increase physical activity as tolerated Repeat fasting lipids and LFTs with PCP  Obesity-weight today 136/82.   Limited in physical activity due to bilateral hip pain. Continue weight loss Reduced calorie diet  Disposition: Follow-up with Dr. Sallyanne Kuster or me in 12 months.   Jossie Ng. Cicely Ortner NP-C     05/22/2022, 8:44 AM [place] [address] Office [phone] Fax [phone]    I spent 14 minutes examining this patient, reviewing medications, and using patient centered shared decision making involving her cardiac care.  Prior to her visit I spent greater than 20 minutes reviewing her past medical history,  medications, and prior cardiac tests.

## 2022-05-22 ENCOUNTER — Ambulatory Visit: Payer: 59 | Attending: General Practice | Admitting: General Practice

## 2022-05-22 ENCOUNTER — Encounter: Payer: Self-pay | Admitting: General Practice

## 2022-05-22 VITALS — BP 136/82 | HR 50 | Ht 79.0 in | Wt 319.6 lb

## 2022-05-22 DIAGNOSIS — I10 Essential (primary) hypertension: Secondary | ICD-10-CM

## 2022-05-22 DIAGNOSIS — E668 Other obesity: Secondary | ICD-10-CM | POA: Diagnosis not present

## 2022-05-22 DIAGNOSIS — E7800 Pure hypercholesterolemia, unspecified: Secondary | ICD-10-CM

## 2022-05-22 MED ORDER — AMLODIPINE BESYLATE 10 MG PO TABS: 10.0000 mg | ORAL_TABLET | Freq: Every day | ORAL | 3 refills | Status: DC

## 2022-05-22 MED ORDER — BENAZEPRIL HCL 40 MG PO TABS: 40.0000 mg | ORAL_TABLET | Freq: Every day | ORAL | 3 refills | Status: DC

## 2022-05-22 MED ORDER — ATORVASTATIN CALCIUM 40 MG PO TABS: 40.0000 mg | ORAL_TABLET | Freq: Every day | ORAL | 3 refills | Status: DC

## 2022-05-22 NOTE — Patient Instructions (Signed)
Medication Instructions:  The current medical regimen is effective;  continue present plan and medications as directed. Please refer to the Current Medication list given to you today.  *If you need a refill on your cardiac medications before your next appointment, please call your pharmacy*  Lab Work: HAVE PRIMARY MD FAX TO [place] AT [phone] If you have labs (blood work) drawn today and your tests are completely normal, you will receive your results only by: [place] (if you have MyChart) OR  A paper copy in the mail If you have any lab test that is abnormal or we need to change your treatment, we will call you to review the results.  Testing/Procedures: NONE   Follow-Up: At [facility], you and your health needs are our priority.  As part of our continuing mission to provide you with exceptional heart care, we have created designated Provider Care Teams.  These Care Teams include your primary Cardiologist (physician) and Advanced Practice Providers (APPs -  Physician Assistants and Nurse Practitioners) who all work together to provide you with the care you need, when you need it.  We recommend signing up for the patient portal called "MyChart".  Sign up information is provided on this After Visit Summary.  MyChart is used to connect with patients for Virtual Visits (Telemedicine).  Patients are able to view lab/test results, encounter notes, upcoming appointments, etc.  Non-urgent messages can be sent to your provider as well.   To learn more about what you can do with MyChart, go to [email or web address].    Your next appointment:   12 month(s)  Provider:   Sanda Klein, MD  or Coletta Memos, FNP or ANY APP        Other Instructions INCREASE PHYSICAL ACTIVITY AS TOLERATED ON YOUR DAYS OFF  PLEASE PURCHASE AND WEAR COMPRESSION STOCKINGS DAILY AND TAKE OFF AT BEDTIME. Compression stockings are elastic socks that squeeze the legs. They help to increase blood flow to  the legs and to decrease swelling in the legs from fluid retention, and reduce the chance of developing blood clots in the lower legs. Please put on in the AM when dressing and off at night when dressing for bed.  LET THEM KNOW THAT YOU NEED KNEE HIGH'S WITH COMPRESSION OF 15-20 mmhg.  [employer]  [place] ([place] [phone]); [place], [place] [address]; [phone]  EMAIL   [email or web address].  PLEASE MAKE SURE TO ELEVATE YOUR FEET & LEGS WHILE SITTING, THIS WILL HELP WITH THE SWELLING ALSO.   PLEASE READ AND FOLLOW ATTACHED  SALTY 6

## 2022-05-23 IMAGING — CT CT ABD-PELV W/ CM
2 of 5 series · 16 of 46 positions shown, 18 images · IV contrast (APPLIED)
Comparison: January 02, 2017.

CLINICAL DATA: Right upper quadrant abdominal pain for 2 months.

EXAM:
CT ABDOMEN AND PELVIS WITH CONTRAST
TECHNIQUE: Multidetector CT imaging of the abdomen and pelvis was performed
using the standard protocol following bolus administration of
intravenous contrast.
CONTRAST:  100mL OMNIPAQUE IOHEXOL 300 MG/ML  SOLN

[Series 2: abd pel w · axial · 0.93mm/px · z∈[-519,-49]mm · 13 of 106 slices shown, 15 images]
[im 6/106  soft-tissue]
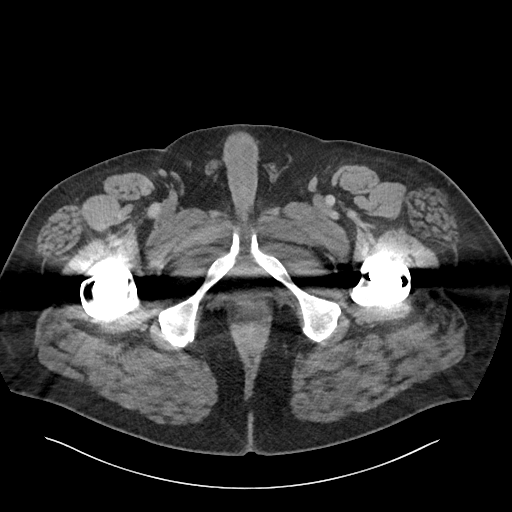
[im 6/106  bone]
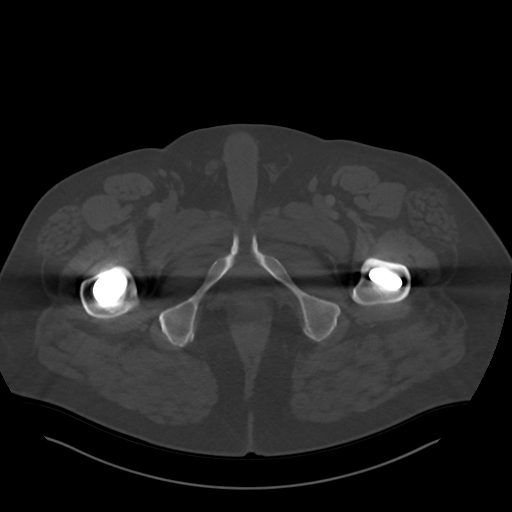
[im 17/106  soft-tissue]
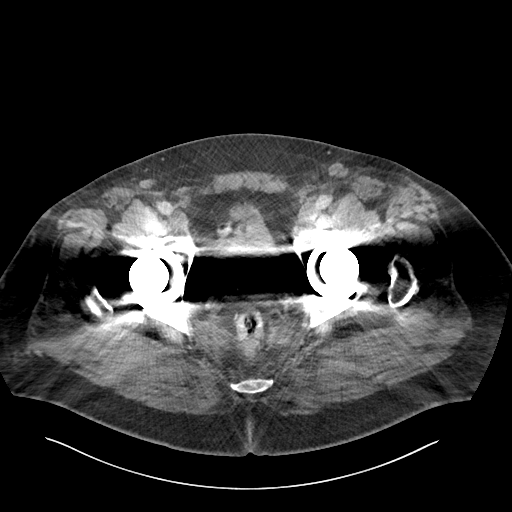
[im 23/106  soft-tissue]
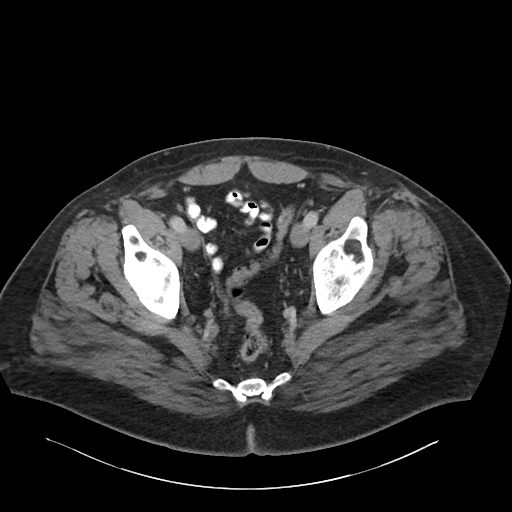
[im 28/106  soft-tissue]
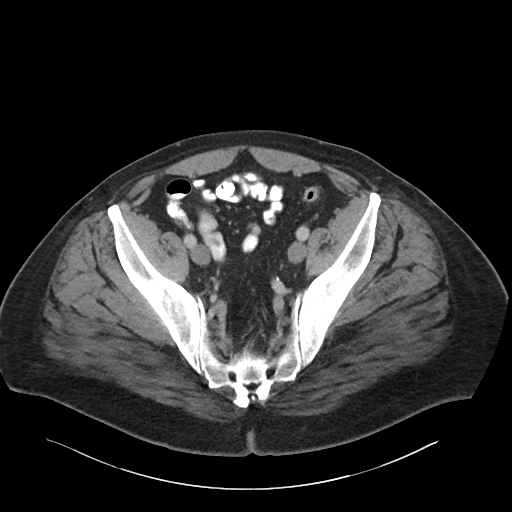
[im 39/106  soft-tissue]
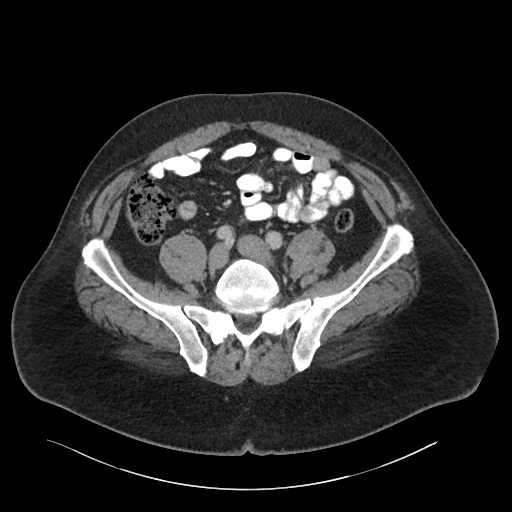
[im 45/106  soft-tissue]
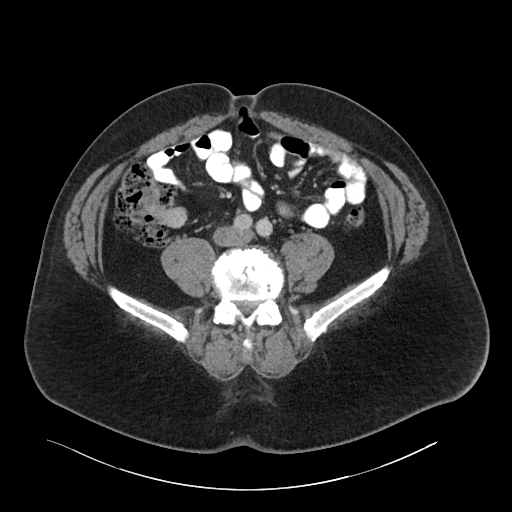
[im 56/106  soft-tissue]
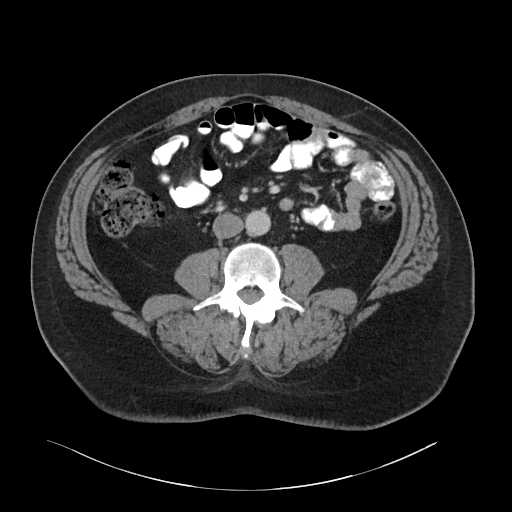
[im 61/106  soft-tissue]
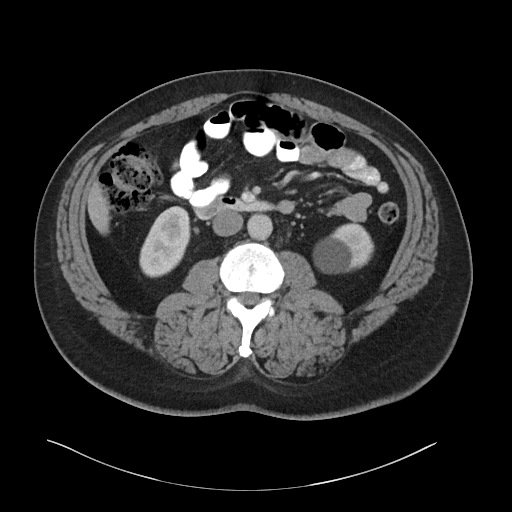
[im 67/106  soft-tissue]
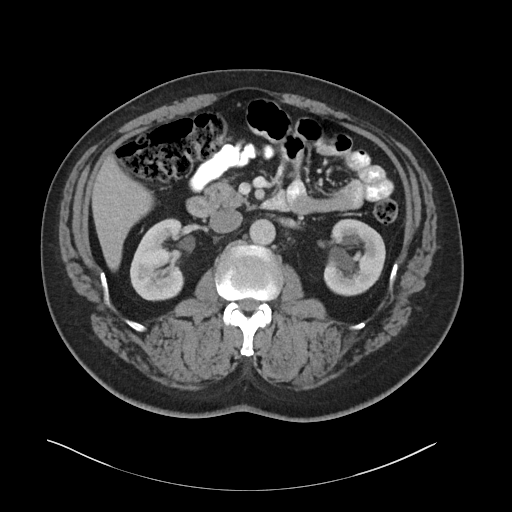
[im 67/106  bone]
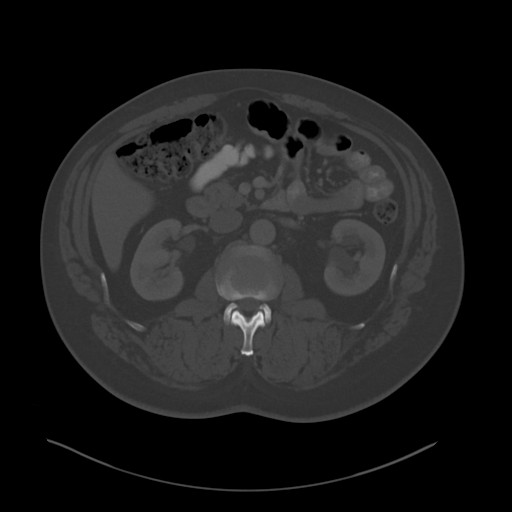
[im 78/106  soft-tissue]
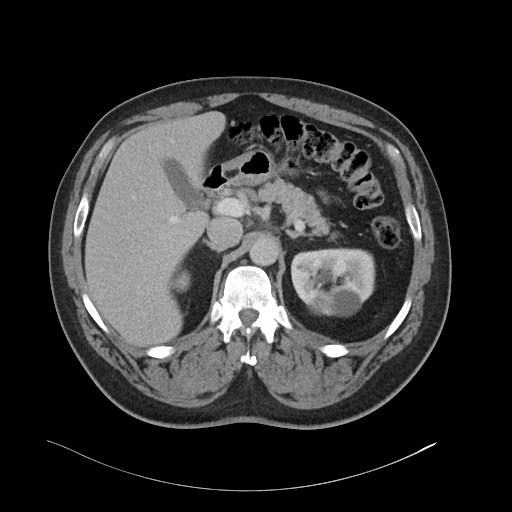
[im 83/106  soft-tissue]
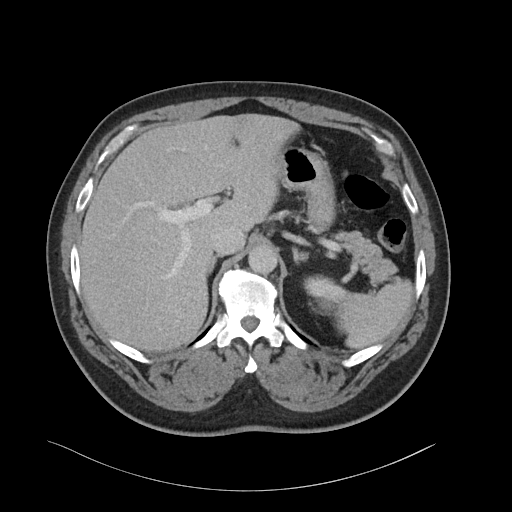
[im 89/106  soft-tissue]
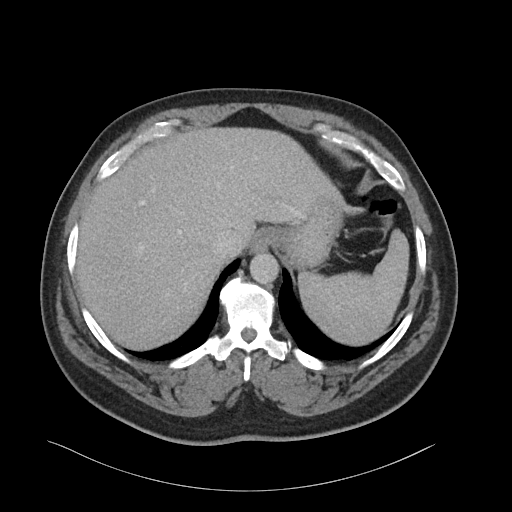
[im 100/106  soft-tissue]
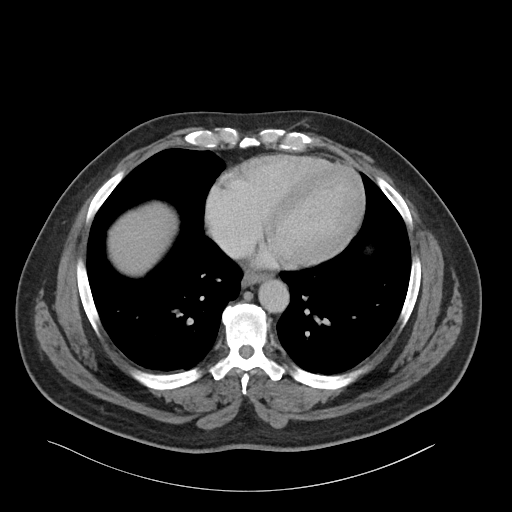

[Series 5: coronal · coronal · 0.90mm/px · 3 of 122 slices shown]
[im 41/122  soft-tissue]
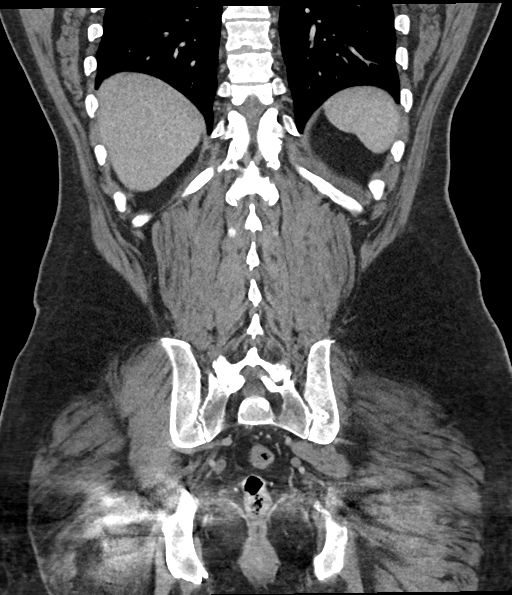
[im 54/122  soft-tissue]
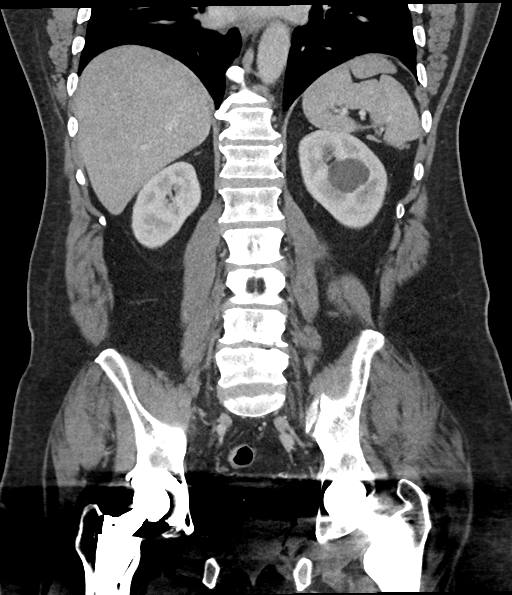
[im 68/122  soft-tissue]
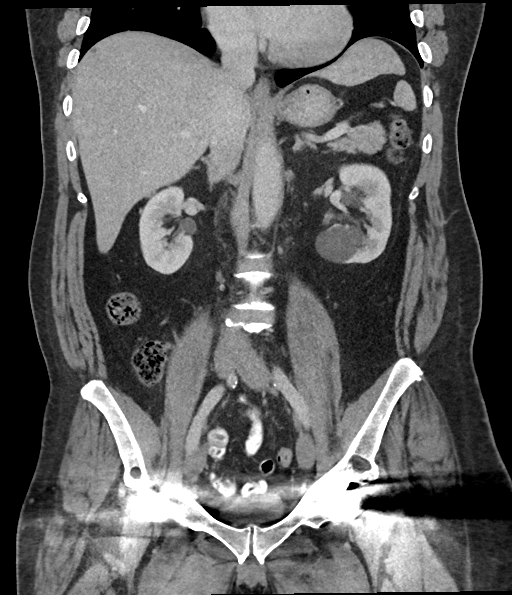

[16 of 46 positions shown; findings below may reference images not displayed]

FINDINGS: Lower chest: No acute abnormality.

Hepatobiliary: No focal liver abnormality is seen. No gallstones,
gallbladder wall thickening, or biliary dilatation.

Pancreas: Unremarkable. No pancreatic ductal dilatation or
surrounding inflammatory changes.

Spleen: Normal in size without focal abnormality.

Adrenals/Urinary Tract: Adrenal glands appear normal. Left renal
cysts are noted. No hydronephrosis or renal obstruction is noted.
Urinary bladder is not well visualized due to scatter artifact
arising from bilateral hip arthroplasties.

Stomach/Bowel: Stomach is within normal limits. Appendix appears
normal. No evidence of bowel wall thickening, distention, or
inflammatory changes.

Vascular/Lymphatic: No significant vascular findings are present. No
enlarged abdominal or pelvic lymph nodes.

Reproductive: Prostate is unremarkable.

Other: Small fat containing periumbilical hernia. No ascites is
noted.

Musculoskeletal: Status post bilateral total hip arthroplasties. No
acute osseous abnormality is noted.
IMPRESSION: Small fat containing periumbilical hernia.

No acute abnormality seen in the abdomen or pelvis.

## 2022-06-08 ENCOUNTER — Ambulatory Visit: Payer: 59 | Admitting: Urology

## 2022-06-18 ENCOUNTER — Other Ambulatory Visit: Payer: Self-pay | Admitting: Podiatry

## 2022-09-18 ENCOUNTER — Ambulatory Visit (HOSPITAL_COMMUNITY): Payer: 59

## 2023-06-07 ENCOUNTER — Other Ambulatory Visit (HOSPITAL_COMMUNITY): Payer: Self-pay | Admitting: Internal Medicine

## 2023-06-07 DIAGNOSIS — R1084 Generalized abdominal pain: Secondary | ICD-10-CM

## 2023-06-15 ENCOUNTER — Other Ambulatory Visit: Payer: Self-pay | Admitting: General Practice

## 2023-06-15 DIAGNOSIS — I10 Essential (primary) hypertension: Secondary | ICD-10-CM

## 2023-06-25 ENCOUNTER — Ambulatory Visit: Payer: 59 | Admitting: Cardiovascular Disease

## 2023-07-16 ENCOUNTER — Other Ambulatory Visit (HOSPITAL_COMMUNITY): Payer: Self-pay | Admitting: Internal Medicine

## 2023-07-16 DIAGNOSIS — R1011 Right upper quadrant pain: Secondary | ICD-10-CM

## 2023-07-27 ENCOUNTER — Ambulatory Visit (HOSPITAL_COMMUNITY)
Admission: RE | Admit: 2023-07-27 | Discharge: 2023-07-27 | Disposition: A | Discharge status: Home / Self Care | Source: Ambulatory Visit | Attending: Internal Medicine | Admitting: Internal Medicine

## 2023-07-27 DIAGNOSIS — R1011 Right upper quadrant pain: Secondary | ICD-10-CM | POA: Insufficient documentation

## 2023-08-19 NOTE — Progress Notes (Unsigned)
 Cardiology Office Note:    Date:  08/20/2023   ID:  George Maldonado, DOB 1965/07/03, MRN [id number]  PCP:  Kathyleen Parkins, MD   [facility]  Cardiologist:  Luana Rumple, MD  Advanced Practice Provider:  No care team member to display Electrophysiologist:  None   Referring MD: Minus Amel, MD   No chief complaint on file.   History of Present Illness:    George Maldonado is a 58 y.o. male with a hx of essential hypertension, hyperlipidemia, gout and hyperuricemia, who had several episodes of near syncope and symptoms of orthostatic hypotension leading to a brief hospitalization in September 2021.  At that time he was taking a thiazide diuretic.  Medications were adjusted to avoid the use of diuretics and since then he has not had any complaints of dizziness, syncope or any gout attacks.  He has not had any gout attacks since stopping the thiazide.  He has had some unpleasantly discoloration in his right upper quadrant.  Labs including liver function tests and pancreatic tests were normal, but a right upper quadrant ultrasound showed evidence of hepatic steatosis.  He is a former basketball and football player and is very tall, but also moderately obese.  Had previous bilateral hip arthroplasties and has recurrent problems with pain in his hips.  He tends to be relatively bradycardic with a heart rate in the 50s.  Had a little bit of trouble with pretibial edema after starting amlodipine , but this is infrequent.  He denies problems with erectile dysfunction.  The patient specifically denies any chest pain at rest or with exertion, dyspnea at rest or with exertion, orthopnea, paroxysmal nocturnal dyspnea, syncope, palpitations, focal neurological deficits, intermittent claudication, lower extremity edema, unexplained weight gain, cough, hemoptysis or wheezing.  He is not taking any NSAIDs at this time.  Past Medical History:  Diagnosis Date    Arthritis    GERD (gastroesophageal reflux disease)    occasionally   Gout    Heart murmur    as a child   Hypertension     Past Surgical History:  Procedure Laterality Date   COLONOSCOPY N/A 02/10/2018   Procedure: COLONOSCOPY;  Surgeon: Ruby Corporal, MD;  [facility];  Service: Endoscopy;  Laterality: N/A;  730   NO PAST SURGERIES     TOTAL HIP ARTHROPLASTY Right 08/01/2012   Procedure: TOTAL HIP ARTHROPLASTY;  Surgeon: Ilean Mall, MD;  Location: [facility] OR;  Service: Orthopedics;  Laterality: Right;  DEPUY, NEEDS LONG TABLE PATIENT 6'8" 290 LBS   TOTAL HIP ARTHROPLASTY Left 11/02/2016   Procedure: LEFT TOTAL HIP ARTHROPLASTY ANTERIOR APPROACH;  Surgeon: Wendolyn Hamburger, MD;  Location: [facility] OR;  Service: Orthopedics;  Laterality: Left;    Current Medications: Current Meds  Medication Sig   allopurinol  (ZYLOPRIM ) 300 MG tablet Take 300 mg by mouth every evening. (1900)   amLODipine  (NORVASC ) 10 MG tablet TAKE 1 TABLET BY MOUTH EVERY DAY   atorvastatin  (LIPITOR) 40 MG tablet TAKE 1 TABLET BY MOUTH DAILY. SCHEDULE OFFICE VISIT FOR FUTURE REFILLS.   benazepril  (LOTENSIN ) 40 MG tablet TAKE 1 TABLET BY MOUTH EVERY DAY     Allergies:   Patient has no known allergies.   Family History: The patient's family history includes Heart disease in his father; Kidney disease in his brother.  His brother was on hemodialysis  ROS:   Please see the history of present illness.     All other systems reviewed and are  negative.  EKGs/Labs/Other Studies Reviewed:    The following studies were reviewed today:   EKG:  EKG is  ordered today.  The ekg ordered today demonstrates mild sinus bradycardia, otherwise completely normal tracing, QTC 406 ms  EKG Interpretation Date/Time:  Friday Aug 20 2023 08:26:14 EDT Ventricular Rate:  50 PR Interval:  190 QRS Duration:  92 QT Interval:  430 QTC Calculation: 392 R Axis:   27  Text Interpretation: Sinus bradycardia When compared with ECG of  22-Oct-2016 08:56, No significant change was found Confirmed by Adorian Gwynne ([id number]) on 08/20/2023 9:00:55 AM         Recent Labs: No results found for requested labs within last 365 days.  02/22/2023 Creatinine 0.94, Potassium 4.6, normal LFTs, glucose 90, Hgb 13.7   Recent Lipid Panel    Component Value Date/Time   CHOL 172 06/05/2020 1002   TRIG 76 06/05/2020 1002   HDL 55 06/05/2020 1002   CHOLHDL 3.1 06/05/2020 1002   LDLCALC 103 (H) 06/05/2020 1002     09/15/2022 cholesterol 137, TG 82, HDL 42, LDL 79  Risk Assessment/Calculations:     Physical Exam:    VS:  BP 122/80 (BP Location: Left Arm, Patient Position: Sitting, Cuff Size: Large)   Pulse (!) 50   Ht 6\' 7"  (2.007 m)   Wt (!) 138.4 kg   SpO2 98%   BMI 34.38 kg/m     Wt Readings from Last 3 Encounters:  08/20/23 (!) 138.4 kg  05/22/22 (!) 145 kg  06/05/20 (!) 143.8 kg      General: Alert, oriented x3, no distress, very muscular and fit, but also moderately obese Head: no evidence of trauma, PERRL, EOMI, no exophtalmos or lid lag, no myxedema, no xanthelasma; normal ears, nose and oropharynx Neck: normal jugular venous pulsations and no hepatojugular reflux; brisk carotid pulses without delay and no carotid bruits Chest: clear to auscultation, no signs of consolidation by percussion or palpation, normal fremitus, symmetrical and full respiratory excursions Cardiovascular: normal position and quality of the apical impulse, regular rhythm, normal first and second heart sounds, no murmurs, rubs or gallops Abdomen: no tenderness or distention, no masses by palpation, no abnormal pulsatility or arterial bruits, normal bowel sounds, no hepatosplenomegaly Extremities: no clubbing, cyanosis or edema; 2+ radial, ulnar and brachial pulses bilaterally; 2+ right femoral, posterior tibial and dorsalis pedis pulses; 2+ left femoral, posterior tibial and dorsalis pedis pulses; no subclavian or femoral  bruits Neurological: grossly nonfocal Psych: Normal mood and affect   ASSESSMENT:    1. Essential hypertension   2. History of syncope   3. Pure hypercholesterolemia   4. Moderate obesity   5. Pre-diabetes   6. History of gout     PLAN:    In order of problems listed above:  HTN: Very well-controlled.  Avoid thiazide diuretics due to history of gout.  Avoid scheduled NSAIDs and salt enriched sports drinks. Near syncope: Probably related to diuretic use.  Has not recurred. Hypercholesterolemia: Relatively low HDL cholesterol, but all other parameters in normal range.  Continue atorvastatin . Obesity: Discussed his diagnosis of hepatic steatosis and its relationship to obesity, insulin resistance, risk of diabetes and other cardio metabolic and kidney complications.  Reviewed best dietary changes to help with weight loss, discussed the concept of the glycemic index, the importance of regular exercise. History of gout: On allopurinol .  Check uric acid level.       Medication Adjustments/Labs and Tests Ordered: Current medicines are reviewed at length  with the patient today.  Concerns regarding medicines are outlined above.  Orders Placed This Encounter  Procedures   Uric acid   Lipid panel   Comprehensive metabolic panel with GFR   HgB Y7W   EKG 12-Lead   No orders of the defined types were placed in this encounter.   Patient Instructions  Lab Work: CMET LIPID PANEL  HGB A1C  URIC ACID   If you have labs (blood work) drawn today and your tests are completely normal, you will receive your results only by: MyChart Message (if you have MyChart) OR A paper copy in the mail If you have any lab test that is abnormal or we need to change your treatment, we will call you to review the results.  Follow-Up: At [facility], you and your health needs are our priority.  As part of our continuing mission to provide you with exceptional heart care, our providers are all  part of one team.  This team includes your primary Cardiologist (physician) and Advanced Practice Providers or APPs (Physician Assistants and Nurse Practitioners) who all work together to provide you with the care you need, when you need it.  Your next appointment:   1 year(s)  Provider:   Luana Rumple, MD        Signed, Luana Rumple, MD  08/20/2023 9:05 AM    [facility]

## 2023-08-20 ENCOUNTER — Ambulatory Visit: Attending: Cardiovascular Disease | Admitting: Cardiovascular Disease

## 2023-08-20 ENCOUNTER — Encounter: Payer: Self-pay | Admitting: Cardiovascular Disease

## 2023-08-20 VITALS — BP 122/80 | HR 50 | Ht 79.0 in | Wt 305.2 lb

## 2023-08-20 DIAGNOSIS — E7800 Pure hypercholesterolemia, unspecified: Secondary | ICD-10-CM

## 2023-08-20 DIAGNOSIS — Z87898 Personal history of other specified conditions: Secondary | ICD-10-CM

## 2023-08-20 DIAGNOSIS — E669 Obesity, unspecified: Secondary | ICD-10-CM

## 2023-08-20 DIAGNOSIS — R7303 Prediabetes: Secondary | ICD-10-CM

## 2023-08-20 DIAGNOSIS — I10 Essential (primary) hypertension: Secondary | ICD-10-CM | POA: Diagnosis not present

## 2023-08-20 DIAGNOSIS — Z8739 Personal history of other diseases of the musculoskeletal system and connective tissue: Secondary | ICD-10-CM

## 2023-08-20 NOTE — Patient Instructions (Signed)
 Lab Work: CMET LIPID PANEL  HGB A1C  URIC ACID   If you have labs (blood work) drawn today and your tests are completely normal, you will receive your results only by: MyChart Message (if you have MyChart) OR A paper copy in the mail If you have any lab test that is abnormal or we need to change your treatment, we will call you to review the results.  Follow-Up: At [facility] you and your health needs are our priority.  As part of our continuing mission to provide you with exceptional heart care, our providers are all part of one team.  This team includes your primary Cardiologist (physician) and Advanced Practice Providers or APPs (Physician Assistants and Nurse Practitioners) who all work together to provide you with the care you need, when you need it.  Your next appointment:   1 year(s)  Provider:   Luana Rumple, MD

## 2023-08-21 LAB — HEMOGLOBIN A1C
Est. average glucose Bld gHb Est-mCnc: 123 mg/dL
Hgb A1c MFr Bld: 5.9 % — ABNORMAL HIGH (ref 4.8–5.6)

## 2023-08-21 LAB — COMPREHENSIVE METABOLIC PANEL WITH GFR
ALT: 19 IU/L (ref 0–44)
AST: 17 IU/L (ref 0–40)
Albumin: 4.7 g/dL (ref 3.8–4.9)
Alkaline Phosphatase: 107 IU/L (ref 44–121)
BUN/Creatinine Ratio: 18 (ref 9–20)
BUN: 18 mg/dL (ref 6–24)
Bilirubin Total: 0.7 mg/dL (ref 0.0–1.2)
CO2: 20 mmol/L (ref 20–29)
Calcium: 10.1 mg/dL (ref 8.7–10.2)
Chloride: 99 mmol/L (ref 96–106)
Creatinine, Ser: 1.02 mg/dL (ref 0.76–1.27)
Globulin, Total: 2.3 g/dL (ref 1.5–4.5)
Glucose: 76 mg/dL (ref 70–99)
Potassium: 5.2 mmol/L (ref 3.5–5.2)
Sodium: 139 mmol/L (ref 134–144)
Total Protein: 7 g/dL (ref 6.0–8.5)
eGFR: 86 mL/min/{1.73_m2} (ref >59)

## 2023-08-21 LAB — LIPID PANEL
Chol/HDL Ratio: 3.1 ratio (ref 0.0–5.0)
Cholesterol, Total: 138 mg/dL (ref 100–199)
HDL: 45 mg/dL (ref >39)
LDL Chol Calc (NIH): 78 mg/dL (ref 0–99)
Triglycerides: 77 mg/dL (ref 0–149)
VLDL Cholesterol Cal: 15 mg/dL (ref 5–40)

## 2023-08-21 LAB — URIC ACID: Uric Acid: 5.6 mg/dL (ref 3.8–8.4)

## 2023-08-23 ENCOUNTER — Ambulatory Visit: Payer: Self-pay | Admitting: Cardiovascular Disease

## 2023-08-23 ENCOUNTER — Ambulatory Visit: Admitting: Urology

## 2024-02-12 ENCOUNTER — Ambulatory Visit
Admission: EM | Admit: 2024-02-12 | Discharge: 2024-02-12 | Disposition: A | Discharge status: Home / Self Care | Attending: Nurse Practitioner | Admitting: Nurse Practitioner

## 2024-02-12 ENCOUNTER — Other Ambulatory Visit: Payer: Self-pay

## 2024-02-12 DIAGNOSIS — J029 Acute pharyngitis, unspecified: Secondary | ICD-10-CM | POA: Insufficient documentation

## 2024-02-12 DIAGNOSIS — J069 Acute upper respiratory infection, unspecified: Secondary | ICD-10-CM | POA: Diagnosis present

## 2024-02-12 LAB — POCT RAPID STREP A (OFFICE): Rapid Strep A Screen: NEGATIVE

## 2024-02-12 LAB — POC COVID19/FLU A&B COMBO
Covid Antigen, POC: NEGATIVE
Influenza A Antigen, POC: NEGATIVE
Influenza B Antigen, POC: NEGATIVE

## 2024-02-12 MED ORDER — FLUTICASONE PROPIONATE 50 MCG/ACT NA SUSP: 2.0000 | Freq: Every day | NASAL | 0 refills | Status: AC

## 2024-02-12 MED ORDER — CETIRIZINE HCL 10 MG PO TABS: 10.0000 mg | ORAL_TABLET | Freq: Every day | ORAL | 0 refills | Status: AC

## 2024-02-12 MED ORDER — PSEUDOEPH-BROMPHEN-DM 30-2-10 MG/5ML PO SYRP: 5.0000 mL | ORAL_SOLUTION | Freq: Four times a day (QID) | ORAL | 0 refills | Status: DC | PRN

## 2024-02-12 NOTE — ED Provider Notes (Signed)
 [facility]    CSN: [id number] Arrival date & time: 02/12/24  0803      History   Chief Complaint Chief Complaint  Patient presents with   Sore Throat   Nasal Congestion    HPI George Maldonado is a 58 y.o. male.   The history is provided by the patient.   Patient presents with a 3-day history of sore throat, nasal congestion, runny nose, and cough.  Patient denies fever, chills, ear pain, ear drainage, wheezing, difficulty breathing, abdominal pain, nausea, vomiting, diarrhea, or rash.  Patient states that his wife's been sick with the same or similar symptoms.  States so far he has been taking over-the-counter medications for his symptoms.  Past Medical History:  Diagnosis Date   Arthritis    GERD (gastroesophageal reflux disease)    occasionally   Gout    Heart murmur    as a child   Hypertension     Patient Active Problem List   Diagnosis Date Noted   History of gout 08/20/2023   Pre-diabetes 08/20/2023   Moderate obesity 08/20/2023   Pure hypercholesterolemia 08/20/2023   Essential hypertension 08/20/2023   Special screening for malignant neoplasms, colon 11/04/2017   Primary osteoarthritis of left hip 11/02/2016   Osteoarthritis of left hip 10/30/2016   Osteoarthritis of right hip 08/02/2012    Past Surgical History:  Procedure Laterality Date   COLONOSCOPY N/A 02/10/2018   Procedure: COLONOSCOPY;  Surgeon: Golda Claudis PENNER, MD;  [facility];  Service: Endoscopy;  Laterality: N/A;  730   NO PAST SURGERIES     TOTAL HIP ARTHROPLASTY Right 08/01/2012   Procedure: TOTAL HIP ARTHROPLASTY;  Surgeon: Dempsey JINNY Sensor, MD;  Location: [facility];  Service: Orthopedics;  Laterality: Right;  DEPUY, NEEDS LONG TABLE PATIENT 6'8 290 LBS   TOTAL HIP ARTHROPLASTY Left 11/02/2016   Procedure: LEFT TOTAL HIP ARTHROPLASTY ANTERIOR APPROACH;  Surgeon: Sensor Dempsey, MD;  Location: [facility];  Service: Orthopedics;  Laterality: Left;       Home  Medications    Prior to Admission medications   Medication Sig Start Date End Date Taking? Authorizing Provider  allopurinol  (ZYLOPRIM ) 300 MG tablet Take 300 mg by mouth every evening. (1900)    [provider]  amLODipine  (NORVASC ) 10 MG tablet TAKE 1 TABLET BY MOUTH EVERY DAY 06/15/23   Cleaver, Josefa HERO, NP  atorvastatin  (LIPITOR) 40 MG tablet TAKE 1 TABLET BY MOUTH DAILY. SCHEDULE OFFICE VISIT FOR FUTURE REFILLS. 06/15/23   Emelia Josefa HERO, NP  benazepril  (LOTENSIN ) 40 MG tablet TAKE 1 TABLET BY MOUTH EVERY DAY 06/15/23   Emelia Josefa HERO, NP  diclofenac  (VOLTAREN ) 75 MG EC tablet Take 1 tablet (75 mg total) by mouth 2 (two) times daily. Patient not taking: Reported on 08/20/2023 01/05/20   Magdalen Pasco RAMAN, DPM  diclofenac  (VOLTAREN ) 75 MG EC tablet TAKE 1 TABLET BY MOUTH TWICE A DAY Patient not taking: Reported on 08/20/2023 10/10/21   Magdalen Pasco RAMAN, DPM  famotidine (PEPCID) 20 MG tablet Take 20 mg by mouth 2 (two) times daily. Patient not taking: Reported on 08/20/2023 11/25/20   [provider]  indomethacin  (INDOCIN ) 50 MG capsule Take 50 mg by mouth 3 (three) times daily. Patient not taking: Reported on 08/20/2023 08/30/20   [provider]  zolpidem  (AMBIEN ) 5 MG tablet Take 5 mg by mouth at bedtime as needed. Patient not taking: Reported on 08/20/2023 12/21/20   [provider]  Family History Family History  Problem Relation Age of Onset   Heart disease Father    Kidney disease Brother     Social History Social History   Tobacco Use   Smoking status: Never   Smokeless tobacco: Never  Vaping Use   Vaping status: Never Used  Substance Use Topics   Alcohol  use: No   Drug use: No     Allergies   Patient has no known allergies.   Review of Systems Review of Systems Per HPI  Physical Exam Triage Vital Signs ED Triage Vitals [02/12/24 0829]  Encounter Vitals Group     BP 119/71     Girls Systolic BP Percentile      Girls Diastolic  BP Percentile      Boys Systolic BP Percentile      Boys Diastolic BP Percentile      Pulse Rate 66     Resp 17     Temp 99 F (37.2 C)     Temp Source Oral     SpO2 96 %     Weight      Height      Head Circumference      Peak Flow      Pain Score 7     Pain Loc      Pain Education      Exclude from Growth Chart    No data found.  Updated Vital Signs BP 119/71 (BP Location: Right Arm)   Pulse 66   Temp 99 F (37.2 C) (Oral)   Resp 17   SpO2 96%   Visual Acuity Right Eye Distance:   Left Eye Distance:   Bilateral Distance:    Right Eye Near:   Left Eye Near:    Bilateral Near:     Physical Exam Vitals and nursing note reviewed.  Constitutional:      General: He is not in acute distress.    Appearance: He is well-developed.  HENT:     Head: Normocephalic.     Right Ear: Tympanic membrane, ear canal and external ear normal.     Left Ear: Tympanic membrane, ear canal and external ear normal.     Nose: Congestion present.     Right Turbinates: Enlarged and swollen.     Left Turbinates: Enlarged and swollen.     Right Sinus: No maxillary sinus tenderness or frontal sinus tenderness.     Left Sinus: No maxillary sinus tenderness or frontal sinus tenderness.     Mouth/Throat:     Lips: Pink.     Mouth: Mucous membranes are moist.     Pharynx: Uvula midline. Posterior oropharyngeal erythema and postnasal drip present. No pharyngeal swelling, oropharyngeal exudate or uvula swelling.     Comments: Cobblestoning present to posterior oropharynx  Eyes:     Extraocular Movements: Extraocular movements intact.     Conjunctiva/sclera: Conjunctivae normal.     Pupils: Pupils are equal, round, and reactive to light.  Cardiovascular:     Rate and Rhythm: Normal rate and regular rhythm.     Pulses: Normal pulses.     Heart sounds: Normal heart sounds.  Pulmonary:     Effort: Pulmonary effort is normal. No respiratory distress.     Breath sounds: Normal breath sounds. No  stridor. No wheezing, rhonchi or rales.  Abdominal:     General: Bowel sounds are normal.     Palpations: Abdomen is soft.  Musculoskeletal:     Cervical back: Normal range of  motion.  Skin:    General: Skin is warm and dry.  Neurological:     General: No focal deficit present.     Mental Status: He is alert and oriented to person, place, and time.  Psychiatric:        Mood and Affect: Mood normal.        Behavior: Behavior normal.      UC Treatments / Results  Labs (all labs ordered are listed, but only abnormal results are displayed) Labs Reviewed  POCT RAPID STREP A (OFFICE)  POC COVID19/FLU A&B COMBO    EKG   Radiology No results found.  Procedures Procedures (including critical care time)  Medications Ordered in UC Medications - No data to display  Initial Impression / Assessment and Plan / UC Course  I have reviewed the triage vital signs and the nursing notes.  Pertinent labs & imaging results that were available during my care of the patient were reviewed by me and considered in my medical decision making (see chart for details).  COVID/flu test and rapid strep test were negative.  A throat culture is pending.  On exam, the patient's lung sounds are clear throughout, room air sats are at 96%.  The patient is well-appearing, is in no acute distress, vital signs are stable.  Symptoms are consistent with a viral URI with cough.  Will provide symptomatic treatment with fluticasone 50 mcg nasal spray, Bromfed-DM for the cough, and cetirizine 10 mg.  Supportive care recommendations were provided discussed with the patient to include fluids, rest, over-the-counter analgesics, warm salt water gargles, and use of a humidifier during sleep.  Discussed indications with patient regarding follow-up.  Patient was in agreement with this plan of care and verbalizes understanding.  All questions were answered.  Patient stable for discharge.  Final Clinical Impressions(s) / UC  Diagnoses   Final diagnoses:  None   Discharge Instructions   None    ED Prescriptions   None    PDMP not reviewed this encounter.   Gilmer Etta PARAS, NP 02/12/24 [phone]

## 2024-02-12 NOTE — ED Triage Notes (Signed)
 Pt reports being seen in UC for sore throat, nasal congestion, and cough since Wednesday. Pt denies CP, n/v/d. Pt reports wife was recently sick. Pt denies otc medication today.

## 2024-02-12 NOTE — Discharge Instructions (Addendum)
 The COVID/flu test and rapid strep test were negative.  A throat culture is pending.  You will be contacted if the pending test results are abnormal.  You will also have access to the results via MyChart. Take medication as prescribed. Increase fluids and allow for plenty of rest. You may take over-the-counter Tylenol  or ibuprofen  as needed for pain, fever, or general discomfort. Recommend the use of normal saline nasal spray throughout the day for nasal congestion and runny nose. You may use over-the-counter Chloraseptic throat spray or throat lozenges for throat pain or discomfort.  Also recommend warm salt water gargles 3-4 times daily as needed for throat pain or discomfort. For the cough, recommend the use of a humidifier in your bedroom at nighttime during sleep and to sleep elevated on pillows while symptoms persist. Your symptoms should begin to improve over the next 5 to 7 days.  If your symptoms fail to improve, or begin to worsen, you may follow-up in this clinic or with your primary care physician for further evaluation. Follow-up as needed.

## 2024-02-15 ENCOUNTER — Ambulatory Visit (HOSPITAL_COMMUNITY): Payer: Self-pay

## 2024-02-15 LAB — CULTURE, GROUP A STREP (THRC)

## 2024-02-18 ENCOUNTER — Ambulatory Visit
Admission: EM | Admit: 2024-02-18 | Discharge: 2024-02-18 | Disposition: A | Discharge status: Home / Self Care | Attending: Family Medicine | Admitting: Family Medicine

## 2024-02-18 DIAGNOSIS — J208 Acute bronchitis due to other specified organisms: Secondary | ICD-10-CM

## 2024-02-18 MED ORDER — PSEUDOEPH-BROMPHEN-DM 30-2-10 MG/5ML PO SYRP: 5.0000 mL | ORAL_SOLUTION | Freq: Four times a day (QID) | ORAL | 0 refills | Status: AC | PRN

## 2024-02-18 MED ORDER — DEXAMETHASONE SOD PHOSPHATE PF 10 MG/ML IJ SOLN
10.0000 mg | Freq: Once | INTRAMUSCULAR | Status: AC
  Administered 2024-02-18: 10 mg via INTRAMUSCULAR

## 2024-02-18 NOTE — ED Triage Notes (Signed)
 Pt reports he still has a cough with mucus and sore throat x 6 days   Took cough syurp but no relief

## 2024-02-18 NOTE — Discharge Instructions (Signed)
 We have given you a steroid shot today to help resolve your ongoing symptoms.  I have also refilled the cough syrup and you may continue the over-the-counter remedies additionally.  Follow-up for worsening or unresolving symptoms.

## 2024-02-18 NOTE — ED Provider Notes (Signed)
 [facility]    CSN: [id number] Arrival date & time: 02/18/24  9164      History   Chief Complaint No chief complaint on file.   HPI George Maldonado is a 58 y.o. male.   Patient presenting today with 6 days of ongoing hacking cough, sore throat, nasal congestion, fatigue.  Denies fever, chills, chest pain, shortness of breath, abdominal pain, vomiting, diarrhea.  Tried the prescribed cough syrup from his visit earlier this week with mild relief but has run out.  No known history of chronic pulmonary disease.    Past Medical History:  Diagnosis Date   Arthritis    GERD (gastroesophageal reflux disease)    occasionally   Gout    Heart murmur    as a child   Hypertension     Patient Active Problem List   Diagnosis Date Noted   History of gout 08/20/2023   Pre-diabetes 08/20/2023   Moderate obesity 08/20/2023   Pure hypercholesterolemia 08/20/2023   Essential hypertension 08/20/2023   Special screening for malignant neoplasms, colon 11/04/2017   Primary osteoarthritis of left hip 11/02/2016   Osteoarthritis of left hip 10/30/2016   Osteoarthritis of right hip 08/02/2012    Past Surgical History:  Procedure Laterality Date   COLONOSCOPY N/A 02/10/2018   Procedure: COLONOSCOPY;  Surgeon: Golda Claudis PENNER, MD;  Location: [facility];  Service: Endoscopy;  Laterality: N/A;  730   NO PAST SURGERIES     TOTAL HIP ARTHROPLASTY Right 08/01/2012   Procedure: TOTAL HIP ARTHROPLASTY;  Surgeon: Dempsey JINNY Sensor, MD;  Location: [facility] OR;  Service: Orthopedics;  Laterality: Right;  DEPUY, NEEDS LONG TABLE PATIENT 6'8 290 LBS   TOTAL HIP ARTHROPLASTY Left 11/02/2016   Procedure: LEFT TOTAL HIP ARTHROPLASTY ANTERIOR APPROACH;  Surgeon: Sensor Dempsey, MD;  Location: [facility] OR;  Service: Orthopedics;  Laterality: Left;       Home Medications    Prior to Admission medications   Medication Sig Start Date End Date Taking? Authorizing Provider  allopurinol  (ZYLOPRIM ) 300  MG tablet Take 300 mg by mouth every evening. (1900)    [provider]  amLODipine  (NORVASC ) 10 MG tablet TAKE 1 TABLET BY MOUTH EVERY DAY 06/15/23   Cleaver, Josefa HERO, NP  atorvastatin  (LIPITOR) 40 MG tablet TAKE 1 TABLET BY MOUTH DAILY. SCHEDULE OFFICE VISIT FOR FUTURE REFILLS. 06/15/23   Emelia Josefa HERO, NP  benazepril  (LOTENSIN ) 40 MG tablet TAKE 1 TABLET BY MOUTH EVERY DAY 06/15/23   Emelia Josefa HERO, NP  brompheniramine-pseudoephedrine-DM 30-2-10 MG/5ML syrup Take 5 mLs by mouth 4 (four) times daily as needed. 02/18/24   Stuart Vernell Norris, PA-C  cetirizine (ZYRTEC) 10 MG tablet Take 1 tablet (10 mg total) by mouth daily. 02/12/24   Leath-Warren, Etta JINNY, NP  diclofenac  (VOLTAREN ) 75 MG EC tablet Take 1 tablet (75 mg total) by mouth 2 (two) times daily. Patient not taking: Reported on 08/20/2023 01/05/20   Magdalen Pasco RAMAN, DPM  diclofenac  (VOLTAREN ) 75 MG EC tablet TAKE 1 TABLET BY MOUTH TWICE A DAY Patient not taking: Reported on 08/20/2023 10/10/21   Magdalen Pasco RAMAN, DPM  famotidine (PEPCID) 20 MG tablet Take 20 mg by mouth 2 (two) times daily. Patient not taking: Reported on 08/20/2023 11/25/20   [provider]  fluticasone (FLONASE) 50 MCG/ACT nasal spray Place 2 sprays into both nostrils daily. 02/12/24   Leath-Warren, Etta JINNY, NP  indomethacin  (INDOCIN ) 50 MG capsule Take 50 mg by mouth 3 (three) times  daily. Patient not taking: Reported on 08/20/2023 08/30/20   [provider]  zolpidem  (AMBIEN ) 5 MG tablet Take 5 mg by mouth at bedtime as needed. Patient not taking: Reported on 08/20/2023 12/21/20   [provider]    Family History Family History  Problem Relation Age of Onset   Heart disease Father    Kidney disease Brother     Social History Social History   Tobacco Use   Smoking status: Never   Smokeless tobacco: Never  Vaping Use   Vaping status: Never Used  Substance Use Topics   Alcohol  use: No   Drug use: No     Allergies    Patient has no known allergies.   Review of Systems Review of Systems Per HPI  Physical Exam Triage Vital Signs ED Triage Vitals  Encounter Vitals Group     BP 02/18/24 0843 116/69     Girls Systolic BP Percentile --      Girls Diastolic BP Percentile --      Boys Systolic BP Percentile --      Boys Diastolic BP Percentile --      Pulse Rate 02/18/24 0843 61     Resp 02/18/24 0843 16     Temp 02/18/24 0843 97.7 F (36.5 C)     Temp Source 02/18/24 0843 Oral     SpO2 02/18/24 0843 97 %     Weight --      Height --      Head Circumference --      Peak Flow --      Pain Score 02/18/24 0845 9     Pain Loc --      Pain Education --      Exclude from Growth Chart --    No data found.  Updated Vital Signs BP 116/69 (BP Location: Right Arm)   Pulse 61   Temp 97.7 F (36.5 C) (Oral)   Resp 16   SpO2 97%   Visual Acuity Right Eye Distance:   Left Eye Distance:   Bilateral Distance:    Right Eye Near:   Left Eye Near:    Bilateral Near:     Physical Exam Vitals and nursing note reviewed.  Constitutional:      Appearance: He is well-developed.  HENT:     Head: Atraumatic.     Right Ear: Tympanic membrane and external ear normal.     Left Ear: Tympanic membrane and external ear normal.     Nose: Rhinorrhea present.     Mouth/Throat:     Pharynx: Posterior oropharyngeal erythema present. No oropharyngeal exudate.  Eyes:     Conjunctiva/sclera: Conjunctivae normal.     Pupils: Pupils are equal, round, and reactive to light.  Cardiovascular:     Rate and Rhythm: Normal rate and regular rhythm.  Pulmonary:     Effort: Pulmonary effort is normal. No respiratory distress.     Breath sounds: No wheezing or rales.  Musculoskeletal:        General: Normal range of motion.     Cervical back: Normal range of motion and neck supple.  Lymphadenopathy:     Cervical: No cervical adenopathy.  Skin:    General: Skin is warm and dry.  Neurological:     Mental Status:  He is alert and oriented to person, place, and time.  Psychiatric:        Behavior: Behavior normal.    UC Treatments / Results  Labs (all labs ordered  are listed, but only abnormal results are displayed) Labs Reviewed - No data to display  EKG   Radiology No results found.  Procedures Procedures (including critical care time)  Medications Ordered in UC Medications  dexamethasone  (DECADRON ) injection 10 mg (10 mg Intramuscular Given 02/18/24 0916)    Initial Impression / Assessment and Plan / UC Course  I have reviewed the triage vital signs and the nursing notes.  Pertinent labs & imaging results that were available during my care of the patient were reviewed by me and considered in my medical decision making (see chart for details).     Vitals and exam very reassuring today, given duration and worsening course will treat for viral bronchitis with IM Decadron , refill cough syrup as he states it was helping and discussed supportive over-the-counter medications and home care.  Return for worsening or unresolving symptoms.  Final Clinical Impressions(s) / UC Diagnoses   Final diagnoses:  Viral bronchitis     Discharge Instructions      We have given you a steroid shot today to help resolve your ongoing symptoms.  I have also refilled the cough syrup and you may continue the over-the-counter remedies additionally.  Follow-up for worsening or unresolving symptoms.    ED Prescriptions     Medication Sig Dispense Auth. Provider   brompheniramine-pseudoephedrine-DM 30-2-10 MG/5ML syrup Take 5 mLs by mouth 4 (four) times daily as needed. 140 mL Stuart Vernell Norris, [place]      PDMP not reviewed this encounter.   Stuart Vernell Norris, [place] 02/18/24 1106
